# Patient Record
Sex: Female | Born: 1995 | Race: Black or African American | Hispanic: No | Marital: Single | Smoking: Never smoker
Health system: Southern US, Community
[De-identification: ages and names within clinical notes are randomized; demographics above are authoritative.]

## PROBLEM LIST (undated history)

## (undated) ENCOUNTER — Inpatient Hospital Stay (HOSPITAL_COMMUNITY): Payer: Self-pay

## (undated) ENCOUNTER — Ambulatory Visit (HOSPITAL_COMMUNITY): Admission: EM | Discharge status: Absent without leave | Payer: Medicaid Other

## (undated) DIAGNOSIS — E739 Lactose intolerance, unspecified: Secondary | ICD-10-CM

## (undated) DIAGNOSIS — J40 Bronchitis, not specified as acute or chronic: Secondary | ICD-10-CM

---
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
## 2016-01-11 ENCOUNTER — Ambulatory Visit (INDEPENDENT_AMBULATORY_CARE_PROVIDER_SITE_OTHER): Payer: Medicaid Other | Admitting: Certified Nurse Midwife

## 2016-01-11 VITALS — BP 119/71 | HR 95 | Wt 150.0 lb

## 2016-01-11 DIAGNOSIS — Z3493 Encounter for supervision of normal pregnancy, unspecified, third trimester: Secondary | ICD-10-CM

## 2016-01-11 LAB — POCT URINALYSIS DIPSTICK
BILIRUBIN UA: NEGATIVE
Glucose, UA: NEGATIVE
KETONES UA: NEGATIVE
Nitrite, UA: NEGATIVE
PH UA: 7
RBC UA: NEGATIVE
Spec Grav, UA: 1.01
Urobilinogen, UA: NEGATIVE

## 2016-01-11 NOTE — Progress Notes (Signed)
Subjective:    Senaida OresSharlaye Hunn is a 20 y.o. female being seen today for her obstetrical visit. She is at 4012w3d gestation. Patient reports no bleeding, no contractions, no cramping, no leaking and states she is taking her vitamins and iron. Fetal movement: normal.  Problem List Items Addressed This Visit    None    Visit Diagnoses    Prenatal care in third trimester    -  Primary    Relevant Orders    POCT Urinalysis Dipstick      Patient Active Problem List   Diagnosis Date Noted  . Supervision of pregnancy with history of ectopic pregnancy, second trimester 10/10/2015  . 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 Pelvic pain in female   . Ruptured ectopic pregnancy   . ECZEMA, ATOPIC DERMATITIS 10/16/2006   Objective:    BP 119/71 mmHg  Pulse 95  Wt 150 lb (68.04 kg)  LMP 05/20/2015 FHT:  155 BPM  Uterine Size: 30 cm and size equals dates  Presentation: cephalic     Assessment:    Pregnancy @ 4412w3d weeks   Doing well  Plan:     labs reviewed, problem list updated Consent signed. GBS planning TDAP offered  Rhogam given for RH negative Pediatrician: discussed. Infant feeding: plans to breastfeed. Maternity leave: discussed. Cigarette smoking: never smoked. Orders Placed This Encounter  Procedures  . POCT Urinalysis Dipstick   No orders of the defined types were placed in this encounter.   Follow up in 2 Weeks.

## 2016-01-25 ENCOUNTER — Ambulatory Visit (INDEPENDENT_AMBULATORY_CARE_PROVIDER_SITE_OTHER): Payer: Medicaid Other | Admitting: Certified Nurse Midwife

## 2016-01-25 VITALS — BP 120/73 | HR 81 | Wt 153.0 lb

## 2016-01-25 DIAGNOSIS — Z3483 Encounter for supervision of other normal pregnancy, third trimester: Secondary | ICD-10-CM

## 2016-01-25 LAB — POCT URINALYSIS DIPSTICK
BILIRUBIN UA: NEGATIVE
Blood, UA: NEGATIVE
GLUCOSE UA: NEGATIVE
KETONES UA: NEGATIVE
Leukocytes, UA: NEGATIVE
Nitrite, UA: NEGATIVE
SPEC GRAV UA: 1.025
Urobilinogen, UA: NEGATIVE
pH, UA: 5

## 2016-01-25 NOTE — Addendum Note (Signed)
Addended by: Luella CookMCINTYRE, DIRECE E on: 01/25/2016 09:55 AM   Modules accepted: Orders

## 2016-01-25 NOTE — Progress Notes (Signed)
Subjective:    Deanna Duffy is a 20 y.o. female being seen today for her obstetrical visit. She is at 3024w3d gestation. Patient reports backache, no bleeding, no cramping, no leaking and occasional contractions. Fetal movement: normal.  Problem List Items Addressed This Visit    None     Patient Active Problem List   Diagnosis Date Noted  . Supervision of pregnancy with history of ectopic pregnancy, second trimester 10/10/2015  . 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 Pelvic pain in female   . Ruptured ectopic pregnancy   . ECZEMA, ATOPIC DERMATITIS 10/16/2006   Objective:    BP 120/73 mmHg  Pulse 81  Wt 153 lb (69.4 kg)  LMP 05/20/2015 FHT:  135 BPM  Uterine Size: size equals dates  Presentation: unsure     Assessment:    Pregnancy @ 5324w3d weeks    Plan:     labs reviewed, problem list updated Consent signed. GBS planning TDAP offered  Rhogam given for RH negative Pediatrician: discussed. Infant feeding: plans to breastfeed. Maternity leave: discussed. Cigarette smoking: never smoked. No orders of the defined types were placed in this encounter.   No orders of the defined types were placed in this encounter.   Follow up in 2 Weeks with GBS.

## 2016-02-08 ENCOUNTER — Ambulatory Visit (INDEPENDENT_AMBULATORY_CARE_PROVIDER_SITE_OTHER): Payer: Medicaid Other | Admitting: Certified Nurse Midwife

## 2016-02-08 VITALS — BP 117/81 | HR 80 | Wt 152.0 lb

## 2016-02-08 DIAGNOSIS — K5901 Slow transit constipation: Secondary | ICD-10-CM

## 2016-02-08 DIAGNOSIS — Z3403 Encounter for supervision of normal first pregnancy, third trimester: Secondary | ICD-10-CM

## 2016-02-08 LAB — POCT URINALYSIS DIPSTICK
Bilirubin, UA: NEGATIVE
Glucose, UA: NEGATIVE
Ketones, UA: NEGATIVE
NITRITE UA: NEGATIVE
PH UA: 8
Protein, UA: NEGATIVE
RBC UA: NEGATIVE
UROBILINOGEN UA: NEGATIVE

## 2016-02-08 MED ORDER — DOCUSATE SODIUM 100 MG PO CAPS: 100.0000 mg | ORAL_CAPSULE | Freq: Two times a day (BID) | ORAL | Status: DC

## 2016-02-08 MED ORDER — POLYETHYLENE GLYCOL 3350 17 GM/SCOOP PO POWD: 17.0000 g | Freq: Every day | ORAL | Status: DC

## 2016-02-08 NOTE — Progress Notes (Signed)
Subjective:    Senaida OresSharlaye Headen is a 20 y.o. female being seen today for her obstetrical visit. She is at 5068w3d gestation. Patient reports backache, no bleeding, no contractions, no leaking and constipation. Fetal movement: normal.  Problem List Items Addressed This Visit    None    Visit Diagnoses    Encounter for supervision of normal first pregnancy in third trimester    -  Primary    Relevant Orders    POCT urinalysis dipstick (Completed)      Patient Active Problem List   Diagnosis Date Noted  . Supervision of pregnancy with history of ectopic pregnancy, second trimester 10/10/2015  . 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 Pelvic pain in female   . Ruptured ectopic pregnancy   . ECZEMA, ATOPIC DERMATITIS 10/16/2006   Objective:    BP 117/81 mmHg  Pulse 80  Wt 152 lb (68.947 kg)  LMP 05/20/2015 FHT:  145 BPM  Uterine Size: size equals dates  Presentation: cephalic     0.5 cm dliated & constipated.  Assessment:    Pregnancy @ 6468w3d weeks    Constipation in pregnancy.  Plan:  Colace & Miralax for bowel movement assistance.   labs reviewed, problem list updated Consent signed. GBS sent TDAP offered  Rhogam given for RH negative Pediatrician: discussed. Maternity leave: discussed.  Orders Placed This Encounter  Procedures  . POCT urinalysis dipstick   No orders of the defined types were placed in this encounter.   Follow up in 1 Week.

## 2016-02-09 NOTE — Progress Notes (Signed)
I agree with note by NP Student Andrew Brake.  Was present for exam.  R.Mikya Don CNM 

## 2016-02-10 LAB — STREP GP B NAA: Strep Gp B NAA: NEGATIVE

## 2016-02-12 LAB — NUSWAB VG+, CANDIDA 6SP
ATOPOBIUM VAGINAE: HIGH {score} — AB
CANDIDA GLABRATA, NAA: NEGATIVE
CANDIDA KRUSEI, NAA: NEGATIVE
CANDIDA LUSITANIAE, NAA: NEGATIVE
CANDIDA PARAPSILOSIS, NAA: NEGATIVE
Candida albicans, NAA: NEGATIVE
Candida tropicalis, NAA: NEGATIVE
Chlamydia trachomatis, NAA: NEGATIVE
MEGASPHAERA 1: HIGH {score} — AB
Neisseria gonorrhoeae, NAA: NEGATIVE
TRICH VAG BY NAA: NEGATIVE

## 2016-02-13 ENCOUNTER — Other Ambulatory Visit: Payer: Self-pay | Admitting: Certified Nurse Midwife

## 2016-02-13 DIAGNOSIS — N760 Acute vaginitis: Principal | ICD-10-CM

## 2016-02-13 DIAGNOSIS — B9689 Other specified bacterial agents as the cause of diseases classified elsewhere: Secondary | ICD-10-CM

## 2016-02-13 MED ORDER — METRONIDAZOLE 500 MG PO TABS: 500.0000 mg | ORAL_TABLET | Freq: Two times a day (BID) | ORAL | Status: DC

## 2016-02-15 ENCOUNTER — Telehealth: Payer: Self-pay | Admitting: *Deleted

## 2016-02-15 ENCOUNTER — Ambulatory Visit (INDEPENDENT_AMBULATORY_CARE_PROVIDER_SITE_OTHER): Payer: Medicaid Other | Admitting: Certified Nurse Midwife

## 2016-02-15 VITALS — BP 106/71 | HR 83 | Wt 156.0 lb

## 2016-02-15 DIAGNOSIS — Z3403 Encounter for supervision of normal first pregnancy, third trimester: Secondary | ICD-10-CM

## 2016-02-15 LAB — POCT URINALYSIS DIPSTICK
Bilirubin, UA: NEGATIVE
Blood, UA: NEGATIVE
Glucose, UA: NEGATIVE
Ketones, UA: NEGATIVE
LEUKOCYTES UA: NEGATIVE
NITRITE UA: NEGATIVE
PH UA: 7
PROTEIN UA: NEGATIVE
Spec Grav, UA: 1.01
Urobilinogen, UA: NEGATIVE

## 2016-02-15 NOTE — Telephone Encounter (Signed)
Spoke with Rosanna RandySharlaye to inform that she has BV & that Metronidazole has been sent to pharmacy.

## 2016-02-15 NOTE — Progress Notes (Signed)
Subjective:    Deanna OresSharlaye Duffy is a 20 y.o. female being seen today for her obstetrical visit. She is at 8328w3d gestation. Patient reports no complaints. Fetal movement: normal.  Problem List Items Addressed This Visit    None     Patient Active Problem List   Diagnosis Date Noted  . Supervision of pregnancy with history of ectopic pregnancy, second trimester 10/10/2015  . 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 Pelvic pain in female   . Ruptured ectopic pregnancy   . ECZEMA, ATOPIC DERMATITIS 10/16/2006    Objective:    BP 106/71 mmHg  Pulse 83  Wt 156 lb (70.761 kg)  LMP 05/20/2015 FHT: 159 BPM  Uterine Size: size equals dates  Presentations: cephalic  Pelvic Exam: deferred     Assessment:    Pregnancy @ 4028w3d weeks   Doing well  Plan:   Plans for delivery: Vaginal anticipated; labs reviewed; problem list updated Counseling: Consent signed. Infant feeding: plans to breastfeed. Cigarette smoking: never smoked. L&D discussion: symptoms of labor, discussed when to call, discussed what number to call, anesthetic/analgesic options reviewed and delivering clinician:  plans no preference. Postpartum supports and preparation: circumcision discussed and contraception plans discussed.  Follow up in 1 Week.

## 2016-02-15 NOTE — Addendum Note (Signed)
Addended by: Marya LandryFOSTER, Alainna Stawicki D on: 02/15/2016 12:01 PM   Modules accepted: Orders

## 2016-02-18 ENCOUNTER — Inpatient Hospital Stay (HOSPITAL_COMMUNITY)
Admission: AD | Admit: 2016-02-18 | Discharge: 2016-02-18 | Disposition: A | Discharge status: Home / Self Care | Payer: Medicaid Other | Source: Ambulatory Visit | Attending: Family Medicine | Admitting: Family Medicine

## 2016-02-18 ENCOUNTER — Encounter (HOSPITAL_COMMUNITY): Payer: Self-pay | Admitting: *Deleted

## 2016-02-18 DIAGNOSIS — Z3493 Encounter for supervision of normal pregnancy, unspecified, third trimester: Secondary | ICD-10-CM | POA: Insufficient documentation

## 2016-02-18 NOTE — Progress Notes (Signed)
Dr Ottie GlazierGunadasa notified of pt's admission and status. No new orders

## 2016-02-18 NOTE — Progress Notes (Signed)
Dr Ottie GlazierGunadasa notified of pt's cervical reck and FM strip. Asked for provider to review FM strip

## 2016-02-18 NOTE — Progress Notes (Signed)
Written and verbal d/c instructions given and understanding voiced. 

## 2016-02-18 NOTE — Progress Notes (Signed)
Pt had turned herself to R lat at some point. Semifowlers after sve

## 2016-02-18 NOTE — MAU Note (Signed)
Having contractions since 2300. Lost mucous plug. Went for walk and started having a lot of pressure in pelvis and pain more on R side of abdomen going around to R back. Denies LOF or bleeding

## 2016-02-18 NOTE — Progress Notes (Signed)
Thressa ShellerHeather Hogan CNM reviewed EFM

## 2016-02-18 NOTE — Discharge Instructions (Signed)
Vaginal Delivery °During delivery, your health care provider will help you give birth to your baby. During a vaginal delivery, you will work to push the baby out of your vagina. However, before you can push your baby out, a few things need to happen. The opening of your uterus (cervix) has to soften, thin out, and open up (dilate) all the way to 10 cm. Also, your baby has to move down from the uterus into your vagina.  °SIGNS OF LABOR  °Your health care provider will first need to make sure you are in labor. Signs of labor include:  °· Passing what is called the mucous plug before labor begins. This is a small amount of blood-stained mucus. °· Having regular, painful uterine contractions.   °· The time between contractions gets shorter.   °· The discomfort and pain gradually get more intense. °· Contraction pains get worse when walking and do not go away when resting.   °· Your cervix becomes thinner (effacement) and dilates. °BEFORE THE DELIVERY °Once you are in labor and admitted into the hospital or care center, your health care provider may do the following:  °· Perform a complete physical exam. °· Review any complications related to pregnancy or labor.  °· Check your blood pressure, pulse, temperature, and heart rate (vital signs).   °· Determine if, and when, the rupture of amniotic membranes occurred. °· Do a vaginal exam (using a sterile glove and lubricant) to determine:   °¨ The position (presentation) of the baby. Is the baby's head presenting first (vertex) in the birth canal (vagina), or are the feet or buttocks first (breech)?   °¨ The level (station) of the baby's head within the birth canal.   °¨ The effacement and dilatation of the cervix.   °· An electronic fetal monitor is usually placed on your abdomen when you first arrive. This is used to monitor your contractions and the baby's heart rate. °¨ When the monitor is on your abdomen (external fetal monitor), it can only pick up the frequency and  length of your contractions. It cannot tell the strength of your contractions. °¨ If it becomes necessary for your health care provider to know exactly how strong your contractions are or to see exactly what the baby's heart rate is doing, an internal monitor may be inserted into your vagina and uterus. Your health care provider will discuss the benefits and risks of using an internal monitor and obtain your permission before inserting the device. °¨ Continuous fetal monitoring may be needed if you have an epidural, are receiving certain medicines (such as oxytocin), or have pregnancy or labor complications. °· An IV access tube may be placed into a vein in your arm to deliver fluids and medicines if necessary. °THREE STAGES OF LABOR AND DELIVERY °Normal labor and delivery is divided into three stages. °First Stage °This stage starts when you begin to contract regularly and your cervix begins to efface and dilate. It ends when your cervix is completely open (fully dilated). The first stage is the longest stage of labor and can last from 3 hours to 15 hours.  °Several methods are available to help with labor pain. You and your health care provider will decide which option is best for you. Options include:  °· Opioid medicines. These are strong pain medicines that you can get through your IV tube or as a shot into your muscle. These medicines lessen pain but do not make it go away completely.  °· Epidural. A medicine is given through a thin tube that   is inserted in your back. The medicine numbs the lower part of your body and prevents any pain in that area. °· Paracervical pain medicine. This is an injection of an anesthetic on each side of your cervix.   °· You may request natural childbirth, which does not involve the use of pain medicines or an epidural during labor and delivery. Instead, you will use other things, such as breathing exercises, to help cope with the pain. °Second Stage °The second stage of labor  begins when your cervix is fully dilated at 10 cm. It continues until you push your baby down through the birth canal and the baby is born. This stage can take only minutes or several hours. °· The location of your baby's head as it moves through the birth canal is reported as a number called a station. If the baby's head has not started its descent, the station is described as being at minus 3 (-3). When your baby's head is at the zero station, it is at the middle of the birth canal and is engaged in the pelvis. The station of your baby helps indicate the progress of the second stage of labor. °· When your baby is born, your health care provider may hold the baby with his or her head lowered to prevent amniotic fluid, mucus, and blood from getting into the baby's lungs. The baby's mouth and nose may be suctioned with a small bulb syringe to remove any additional fluid. °· Your health care provider may then place the baby on your stomach. It is important to keep the baby from getting cold. To do this, the health care provider will dry the baby off, place the baby directly on your skin (with no blankets between you and the baby), and cover the baby with warm, dry blankets.   °· The umbilical cord is cut. °Third Stage °During the third stage of labor, your health care provider will deliver the placenta (afterbirth) and make sure your bleeding is under control. The delivery of the placenta usually takes about 5 minutes but can take up to 30 minutes. After the placenta is delivered, a medicine may be given either by IV or injection to help contract the uterus and control bleeding. If you are planning to breastfeed, you can try to do so now. °After you deliver the placenta, your uterus should contract and get very firm. If your uterus does not remain firm, your health care provider will massage it. This is important because the contraction of the uterus helps cut off bleeding at the site where the placenta was attached  to your uterus. If your uterus does not contract properly and stay firm, you may continue to bleed heavily. If there is a lot of bleeding, medicines may be given to contract the uterus and stop the bleeding.  °  °This information is not intended to replace advice given to you by your health care provider. Make sure you discuss any questions you have with your health care provider. °  °Document Released: 05/14/2008 Document Revised: 08/26/2014 Document Reviewed: 04/01/2012 °Elsevier Interactive Patient Education ©2016 [employer] ° °

## 2016-02-23 ENCOUNTER — Ambulatory Visit (INDEPENDENT_AMBULATORY_CARE_PROVIDER_SITE_OTHER): Payer: Medicaid Other | Admitting: Certified Nurse Midwife

## 2016-02-23 VITALS — BP 124/55 | HR 52 | Wt 154.0 lb

## 2016-02-23 DIAGNOSIS — O0913 Supervision of pregnancy with history of ectopic or molar pregnancy, third trimester: Secondary | ICD-10-CM

## 2016-02-23 DIAGNOSIS — O0912 Supervision of pregnancy with history of ectopic or molar pregnancy, second trimester: Secondary | ICD-10-CM

## 2016-02-23 DIAGNOSIS — Z3403 Encounter for supervision of normal first pregnancy, third trimester: Secondary | ICD-10-CM

## 2016-02-23 LAB — POCT URINALYSIS DIPSTICK
Bilirubin, UA: NEGATIVE
Blood, UA: NEGATIVE
GLUCOSE UA: NEGATIVE
Ketones, UA: NEGATIVE
LEUKOCYTES UA: NEGATIVE
Nitrite, UA: NEGATIVE
SPEC GRAV UA: 1.015
UROBILINOGEN UA: NEGATIVE
pH, UA: 6

## 2016-02-23 NOTE — Assessment & Plan Note (Signed)
  Clinic  [facility] Prenatal Labs  Dating  US 07/19/15 Blood type: O/POS/-- (02/21 1552)   Genetic Screen 1 Screen:    AFP:  Negative   Quad:     NIPS: Antibody:NEG (02/21 1552)  Anatomic US  Normal Rubella: 6.94 (02/21 1552)  GTT Early:               Third trimester: Normal RPR: Non Reactive (04/28 1106)   Flu vaccine  HBsAg: NEGATIVE (02/21 1552)   TDaP vaccine                                               Rhogam:N/A HIV: Non Reactive (04/28 1106)   Baby Food     Breast                                          ZOX:WRUEAVWUGBS:Negative (06/22 1647)(For PCN allergy, check sensitivities)  Contraception   Rutha BouchardKyleena IUD Pap: N/A  Circumcision  N/A   [facility]  [facility]   Support Person

## 2016-02-23 NOTE — Progress Notes (Signed)
Subjective:    Deanna Duffy is a 20 y.o. female being seen today for her obstetrical visit. She is at 7268w4d gestation. Patient reports no complaints. Fetal movement: normal.  Problem List Items Addressed This Visit      Other   Supervision of pregnancy with history of ectopic pregnancy, second trimester     Clinic  [facility] Prenatal Labs  Dating  US 07/19/15 Blood type: O/POS/-- (02/21 1552)   Genetic Screen 1 Screen:    AFP:  Negative   Quad:     NIPS: Antibody:NEG (02/21 1552)  Anatomic US  Normal Rubella: 6.94 (02/21 1552)  GTT Early:               Third trimester: Normal RPR: Non Reactive (04/28 1106)   Flu vaccine  HBsAg: NEGATIVE (02/21 1552)   TDaP vaccine                                               Rhogam:N/A HIV: Non Reactive (04/28 1106)   Baby Food     Breast                                          ZOX:WRUEAVWUGBS:Negative (06/22 1647)(For PCN allergy, check sensitivities)  Contraception   Rutha BouchardKyleena IUD Pap: N/A  Circumcision  N/A   [facility]   Support Person           Other Visit Diagnoses    Encounter for supervision of normal first pregnancy in third trimester    -  Primary    Relevant Orders    POCT urinalysis dipstick      Patient Active Problem List   Diagnosis Date Noted  . Supervision of pregnancy with history of ectopic pregnancy, second trimester 10/10/2015  . 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 Pelvic pain in female   . Ruptured ectopic pregnancy   . ECZEMA, ATOPIC DERMATITIS 10/16/2006    Objective:    BP 124/55 mmHg  Pulse 52  Wt 154 lb (69.854 kg)  LMP 05/20/2015 FHT: 140 BPM  Uterine Size: size equals dates  Presentations: cephalic  Pelvic Exam: deferred     Assessment:    Pregnancy @ 1568w4d weeks   Doing well  Plan:   Plans for delivery: Vaginal anticipated; labs reviewed; problem list updated Counseling: Consent signed. Infant feeding: plans to breastfeed. Cigarette smoking: quit at start of pregnancy. L&D  discussion: symptoms of labor, discussed when to call, discussed what number to call, anesthetic/analgesic options reviewed and delivering clinician:  plans no preference. Postpartum supports and preparation: circumcision discussed and contraception plans discussed.  Follow up in 1 Week.

## 2016-02-27 ENCOUNTER — Encounter (HOSPITAL_COMMUNITY): Payer: Self-pay

## 2016-02-27 ENCOUNTER — Inpatient Hospital Stay (HOSPITAL_COMMUNITY)
Admission: AD | Admit: 2016-02-27 | Discharge: 2016-02-27 | Disposition: A | Discharge status: Home / Self Care | Payer: Medicaid Other | Source: Ambulatory Visit | Attending: Obstetrics & Gynecology | Admitting: Obstetrics & Gynecology

## 2016-02-27 DIAGNOSIS — Z3A00 Weeks of gestation of pregnancy not specified: Secondary | ICD-10-CM | POA: Insufficient documentation

## 2016-02-27 DIAGNOSIS — Z3403 Encounter for supervision of normal first pregnancy, third trimester: Secondary | ICD-10-CM | POA: Insufficient documentation

## 2016-02-27 NOTE — MAU Note (Signed)
Been contracting since yesterday.  Getting closer and stronger.  Kept waking her during the night. No bleeding.  No leaking.

## 2016-02-29 ENCOUNTER — Ambulatory Visit (INDEPENDENT_AMBULATORY_CARE_PROVIDER_SITE_OTHER): Payer: Medicaid Other | Admitting: Certified Nurse Midwife

## 2016-02-29 VITALS — BP 122/78 | HR 69 | Temp 98.1°F | Wt 152.0 lb

## 2016-02-29 DIAGNOSIS — Z3403 Encounter for supervision of normal first pregnancy, third trimester: Secondary | ICD-10-CM | POA: Diagnosis not present

## 2016-02-29 DIAGNOSIS — O0912 Supervision of pregnancy with history of ectopic or molar pregnancy, second trimester: Secondary | ICD-10-CM

## 2016-02-29 DIAGNOSIS — O479 False labor, unspecified: Secondary | ICD-10-CM | POA: Diagnosis not present

## 2016-02-29 LAB — POCT URINALYSIS DIPSTICK
Bilirubin, UA: NEGATIVE
GLUCOSE UA: NEGATIVE
Ketones, UA: NEGATIVE
Leukocytes, UA: NEGATIVE
Nitrite, UA: NEGATIVE
RBC UA: NEGATIVE
SPEC GRAV UA: 1.02
UROBILINOGEN UA: NEGATIVE
pH, UA: 6

## 2016-02-29 MED ORDER — OXYCODONE-ACETAMINOPHEN 5-325 MG PO TABS: 1.0000 | ORAL_TABLET | ORAL | Status: DC | PRN

## 2016-02-29 NOTE — Progress Notes (Signed)
Subjective:    Deanna Duffy is a 20 y.o. female being seen today for her obstetrical visit. She is at 7369w3d gestation. Patient reports no bleeding, no leaking and occasional contractions. Fetal movement: normal.  Problem List Items Addressed This Visit    None    Visit Diagnoses    Encounter for supervision of normal first pregnancy in third trimester    -  Primary    Relevant Orders    POCT urinalysis dipstick      Patient Active Problem List   Diagnosis Date Noted  . Supervision of pregnancy with history of ectopic pregnancy, second trimester 10/10/2015  . 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 Pelvic pain in female   . Ruptured ectopic pregnancy   . ECZEMA, ATOPIC DERMATITIS 10/16/2006    Objective:    BP 122/78 mmHg  Pulse 69  Temp(Src) 98.1 F (36.7 C)  Wt 152 lb (68.947 kg)  LMP 05/20/2015 FHT: 150 BPM  Uterine Size: 35 cm and size equals dates  Presentations: cephalic  Pelvic Exam:              Dilation: 1cm       Effacement: 50%             Station:  -3    Consistency: soft            Position: posterior     Assessment:    Pregnancy @ 4569w3d weeks   Braxton Hicks Contractions  Plan:   Plans for delivery: Vaginal anticipated; labs reviewed; problem list updated Counseling: Consent signed. Infant feeding: plans to breastfeed. Cigarette smoking: never smoked. L&D discussion: symptoms of labor, discussed when to call, discussed what number to call, anesthetic/analgesic options reviewed and delivering clinician:  plans no preference. Postpartum supports and preparation: circumcision discussed and contraception plans discussed.  Follow up in 1 Week with NST.

## 2016-02-29 NOTE — Progress Notes (Signed)
Patient was seen at [facility] a couple days ago- she is having contractions-but not regular enough.

## 2016-02-29 NOTE — Assessment & Plan Note (Signed)
  Clinic  [facility] Prenatal Labs  Dating  Blood type: O/POS/-- (02/21 1552)   Genetic Screen 1 Screen:    AFP:     Quad:     NIPS: Antibody:NEG (02/21 1552)  Anatomic US  Rubella: 6.94 (02/21 1552)  GTT Early:               Third trimester:  RPR: Non Reactive (04/28 1106)   Flu vaccine  HBsAg: NEGATIVE (02/21 1552)   TDaP vaccine                                               Rhogam: N/A HIV: Non Reactive (04/28 1106)   Baby Food                Breast                               FAO:ZHYQMVHQGBS:Negative (06/22 1647)(For PCN allergy, check sensitivities)  Contraception  IUD Pap:  Circumcision  N/A   [facility]  [facility]   Support Person

## 2016-03-02 ENCOUNTER — Inpatient Hospital Stay (HOSPITAL_COMMUNITY)
Admission: AD | Admit: 2016-03-02 | Discharge: 2016-03-07 | DRG: 766 | Disposition: A | Discharge status: Home / Self Care | Payer: Medicaid Other | Source: Ambulatory Visit | Attending: Obstetrics & Gynecology | Admitting: Obstetrics & Gynecology

## 2016-03-02 ENCOUNTER — Encounter (HOSPITAL_COMMUNITY): Payer: Self-pay | Admitting: Certified Nurse Midwife

## 2016-03-02 ENCOUNTER — Inpatient Hospital Stay (HOSPITAL_COMMUNITY): Payer: Medicaid Other | Admitting: Anesthesiology

## 2016-03-02 DIAGNOSIS — Z3A39 39 weeks gestation of pregnancy: Secondary | ICD-10-CM | POA: Diagnosis not present

## 2016-03-02 DIAGNOSIS — O1414 Severe pre-eclampsia complicating childbirth: Secondary | ICD-10-CM | POA: Diagnosis present

## 2016-03-02 DIAGNOSIS — Z23 Encounter for immunization: Secondary | ICD-10-CM | POA: Diagnosis not present

## 2016-03-02 DIAGNOSIS — Z91018 Allergy to other foods: Secondary | ICD-10-CM

## 2016-03-02 DIAGNOSIS — Z98891 History of uterine scar from previous surgery: Secondary | ICD-10-CM

## 2016-03-02 DIAGNOSIS — IMO0001 Reserved for inherently not codable concepts without codable children: Secondary | ICD-10-CM

## 2016-03-02 LAB — CBC
HEMATOCRIT: 31.7 % — AB (ref 36.0–46.0)
HEMOGLOBIN: 10.7 g/dL — AB (ref 12.0–15.0)
MCH: 28.1 pg (ref 26.0–34.0)
MCHC: 33.8 g/dL (ref 30.0–36.0)
MCV: 83.2 fL (ref 78.0–100.0)
Platelets: 236 10*3/uL (ref 150–400)
RBC: 3.81 MIL/uL — AB (ref 3.87–5.11)
RDW: 14.7 % (ref 11.5–15.5)
WBC: 13 10*3/uL — ABNORMAL HIGH (ref 4.0–10.5)

## 2016-03-02 LAB — COMPREHENSIVE METABOLIC PANEL
ALBUMIN: 3.2 g/dL — AB (ref 3.5–5.0)
ALT: 14 U/L (ref 14–54)
AST: 17 U/L (ref 15–41)
Alkaline Phosphatase: 195 U/L — ABNORMAL HIGH (ref 38–126)
Anion gap: 9 (ref 5–15)
BILIRUBIN TOTAL: 0.3 mg/dL (ref 0.3–1.2)
BUN: 12 mg/dL (ref 6–20)
CO2: 19 mmol/L — AB (ref 22–32)
Calcium: 9.1 mg/dL (ref 8.9–10.3)
Chloride: 107 mmol/L (ref 101–111)
Creatinine, Ser: 0.81 mg/dL (ref 0.44–1.00)
GFR calc Af Amer: >60 mL/min (ref >60)
GFR calc non Af Amer: >60 mL/min (ref >60)
Glucose, Bld: 81 mg/dL (ref 65–99)
POTASSIUM: 3.8 mmol/L (ref 3.5–5.1)
SODIUM: 135 mmol/L (ref 135–145)
TOTAL PROTEIN: 7.1 g/dL (ref 6.5–8.1)

## 2016-03-02 LAB — PROTEIN / CREATININE RATIO, URINE
Creatinine, Urine: 139 mg/dL
PROTEIN CREATININE RATIO: 0.18 mg/mg{creat} — AB (ref 0.00–0.15)
Total Protein, Urine: 25 mg/dL

## 2016-03-02 LAB — TYPE AND SCREEN
ABO/RH(D): O POS
ANTIBODY SCREEN: NEGATIVE

## 2016-03-02 MED ORDER — ACETAMINOPHEN 500 MG PO TABS: 1000.0000 mg | ORAL_TABLET | Freq: Once | ORAL | Status: DC

## 2016-03-02 MED ORDER — LACTATED RINGERS IV SOLN: 500.0000 mL | Freq: Once | INTRAVENOUS | Status: DC

## 2016-03-02 MED ORDER — LIDOCAINE HCL (PF) 1 % IJ SOLN
INTRAMUSCULAR | Status: DC | PRN
  Administered 2016-03-02: 4 mL via EPIDURAL
  Administered 2016-03-02: 6 mL via EPIDURAL

## 2016-03-02 MED ORDER — EPHEDRINE 5 MG/ML INJ: 10.0000 mg | INTRAVENOUS | Status: DC | PRN

## 2016-03-02 MED ORDER — PHENYLEPHRINE 40 MCG/ML (10ML) SYRINGE FOR IV PUSH (FOR BLOOD PRESSURE SUPPORT): 80.0000 ug | PREFILLED_SYRINGE | INTRAVENOUS | Status: DC | PRN

## 2016-03-02 MED ORDER — DIPHENHYDRAMINE HCL 50 MG/ML IJ SOLN: 12.5000 mg | INTRAMUSCULAR | Status: DC | PRN

## 2016-03-02 MED ORDER — LACTATED RINGERS IV SOLN
INTRAVENOUS | Status: DC
  Administered 2016-03-03: 100 mL/h via INTRAVENOUS

## 2016-03-02 MED ORDER — ONDANSETRON HCL 4 MG/2ML IJ SOLN
4.0000 mg | Freq: Four times a day (QID) | INTRAMUSCULAR | Status: DC | PRN
  Administered 2016-03-03: 4 mg via INTRAVENOUS
  Filled 2016-03-02: qty 2

## 2016-03-02 MED ORDER — OXYTOCIN BOLUS FROM INFUSION: 500.0000 mL | INTRAVENOUS | Status: DC

## 2016-03-02 MED ORDER — LACTATED RINGERS IV SOLN
500.0000 mL | Freq: Once | INTRAVENOUS | Status: AC
  Administered 2016-03-02: 500 mL via INTRAVENOUS

## 2016-03-02 MED ORDER — LIDOCAINE HCL (PF) 1 % IJ SOLN: 30.0000 mL | INTRAMUSCULAR | Status: DC | PRN

## 2016-03-02 MED ORDER — FENTANYL 2.5 MCG/ML BUPIVACAINE 1/10 % EPIDURAL INFUSION (WH - ANES)
14.0000 mL/h | INTRAMUSCULAR | Status: DC | PRN
  Administered 2016-03-02: 12 mL/h via EPIDURAL
  Administered 2016-03-02: 14 mL/h via EPIDURAL
  Filled 2016-03-02: qty 125

## 2016-03-02 MED ORDER — OXYTOCIN 40 UNITS IN LACTATED RINGERS INFUSION - SIMPLE MED
2.5000 [IU]/h | INTRAVENOUS | Status: DC
  Filled 2016-03-02: qty 1000

## 2016-03-02 MED ORDER — SOD CITRATE-CITRIC ACID 500-334 MG/5ML PO SOLN
30.0000 mL | ORAL | Status: DC | PRN
  Administered 2016-03-03: 30 mL via ORAL
  Filled 2016-03-02: qty 15

## 2016-03-02 MED ORDER — PHENYLEPHRINE 40 MCG/ML (10ML) SYRINGE FOR IV PUSH (FOR BLOOD PRESSURE SUPPORT)
80.0000 ug | PREFILLED_SYRINGE | INTRAVENOUS | Status: DC | PRN
  Filled 2016-03-02: qty 10

## 2016-03-02 MED ORDER — LACTATED RINGERS IV SOLN
500.0000 mL | INTRAVENOUS | Status: DC | PRN
  Administered 2016-03-03: 500 mL via INTRAVENOUS

## 2016-03-02 MED ORDER — LACTATED RINGERS IV SOLN
2.0000 g/h | INTRAVENOUS | Status: DC
  Filled 2016-03-02: qty 80

## 2016-03-02 MED ORDER — LABETALOL HCL 5 MG/ML IV SOLN: 20.0000 mg | INTRAVENOUS | Status: DC | PRN

## 2016-03-02 MED ORDER — MAGNESIUM SULFATE BOLUS VIA INFUSION
4.0000 g | Freq: Once | INTRAVENOUS | Status: DC
  Administered 2016-03-02: 4 g via INTRAVENOUS
  Filled 2016-03-02: qty 500

## 2016-03-02 MED ORDER — HYDRALAZINE HCL 20 MG/ML IJ SOLN: 10.0000 mg | Freq: Once | INTRAMUSCULAR | Status: DC | PRN

## 2016-03-02 MED ORDER — LACTATED RINGERS IV SOLN: INTRAVENOUS | Status: DC

## 2016-03-02 MED ORDER — ACETAMINOPHEN 325 MG PO TABS: 650.0000 mg | ORAL_TABLET | ORAL | Status: DC | PRN

## 2016-03-02 NOTE — H&P (Signed)
LABOR AND DELIVERY ADMISSION HISTORY AND PHYSICAL NOTE  Rayvin Abid is a 20 y.o. female G2P0010 with IUP at [redacted]w[redacted]d by 7 wk u/s presenting for painful contractions. 5/10 headache today, no vision change or upper abdominal pain or sob.   She reports positive fetal movement. She denies leakage of fluid or vaginal bleeding.  Prenatal History/Complications:  Past Medical History: Past Medical History  Diagnosis Date  . Bronchitis   . Lactose intolerance     Past Surgical History: Past Surgical History  Procedure Laterality Date  . Laparoscopy N/A 03/07/2015    Procedure: LAPAROSCOPY DIAGNOSTIC;  Surgeon: Willodean Rosenthal, MD;  Location: [facility];  Service: Gynecology;  Laterality: N/A;  . Unilateral salpingectomy Right 03/07/2015    Procedure: UNILATERAL SALPINGECTOMYwith ectopic pregnancy;  Surgeon: Willodean Rosenthal, MD;  Location: [facility];  Service: Gynecology;  Laterality: Right;    Obstetrical History: OB History    Gravida Para Term Preterm AB TAB SAB Ectopic Multiple Living   0      Social History: Social History   Social History  . Marital Status: Single    Spouse Name: N/A  . Number of Children: N/A  . Years of Education: N/A   Social History Main Topics  . Smoking status: Never Smoker   . Smokeless tobacco: Never Used  . Alcohol Use: No  . Drug Use: No  . Sexual Activity: Yes   Other Topics Concern  . None   Social History Narrative    Family History: Family History  Problem Relation Age of Onset  . Cancer Neg Hx   . Diabetes Neg Hx   . Hypertension Neg Hx     Allergies: Allergies  Allergen Reactions  . Prescott Gum [Fish Allergy] Other (See Comments)    Unknown reaction.  . Chocolate Rash    Prescriptions prior to admission  Medication Sig Dispense Refill Last Dose  . iron polysaccharides (NIFEREX) 150 MG capsule Take 1 capsule (150 mg total) by mouth daily. 30 capsule 4 Taking  . mometasone (NASONEX) 50 MCG/ACT nasal spray  Place 2 sprays into the nose daily. 17 g 12 Taking  . oxyCODONE-acetaminophen (PERCOCET/ROXICET) 5-325 MG tablet Take 1 tablet by mouth every 4 (four) hours as needed for severe pain. 20 tablet 0   . Prenatal Vit-Fe Phos-FA-Omega (VITAFOL GUMMIES) 3.33-0.333-34.8 MG CHEW Chew 3 tablets by mouth daily. 90 tablet 12 Taking     Review of Systems   All systems reviewed and negative except as stated in HPI  Blood pressure 143/98, pulse 83, temperature 98.5 F (36.9 C), temperature source Oral, resp. rate 18, last menstrual period 05/20/2015, SpO2 100 %. General appearance: alert, cooperative and appears stated age Lungs: clear to auscultation bilaterally Heart: regular rate and rhythm Abdomen: soft, non-tender; bowel sounds normal Extremities: No calf swelling or tenderness Presentation: cephalic per rn exam Fetal monitoring: 130/mod/+a/-d Uterine activity: q 4-10 min  Dilation: 4.5 Effacement (%): 100 Station: -1 Exam by:: Dione Plover RN   Prenatal labs: ABO, Rh: O/POS/-- (02/21 1552) Antibody: NEG (02/21 1552) Rubella: !Error!immune RPR: Non Reactive (04/28 1106)  HBsAg: NEGATIVE (02/21 1552)  HIV: Non Reactive (04/28 1106)  GBS: Negative (06/22 1647)  2 hr Glucola: 81/128/105 Genetic screening:  Quad wnl3 Anatomy US: wnl  Prenatal Transfer Tool  Maternal Diabetes: No Genetic Screening: Normal Maternal Ultrasounds/Referrals: Normal Fetal Ultrasounds or other Referrals:  None Maternal Substance Abuse:  No Significant Maternal Medications:  None Significant Maternal Lab Results:  Lab values include: Group B Strep negative  No results found for this or any previous visit (from the past 24 hour(s)).  Patient Active Problem List   Diagnosis Date Noted  . Supervision of pregnancy with history of ectopic pregnancy, second trimester 10/10/2015  . 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 Pelvic pain in female   . Ruptured ectopic pregnancy   . ECZEMA, ATOPIC DERMATITIS 10/16/2006     Assessment: Senaida OresSharlaye Snelling is a 20 y.o. G2P0010 at 5557w5d here for active labor. Also initial bp elevated, and with 5/10 headache  #Labor: admit, expectant #Pain: Eventual epidural #FWB: Cat 1 #ID:  gbs neg #MOF: breast #MOC: lng-iud #Circ:  N/a #Elevated bp: mild/mod elevation on first two bp readings. Also with 5/10 ha today. PreE/hellp labs ordered. Will continue to trend bp. Will give tylenol and fluids. If ha persists or other s/s of severe disease present, will start mg. No indication for antihypertensive at this time.  Cherrie Gauzeoah B Julena Barbour 03/02/2016, 7:45 PM

## 2016-03-02 NOTE — Progress Notes (Signed)
Patient ID: Deanna Duffy, female   DOB: 09/26/1995, 20 y.o.   MRN: [id number] Deanna Duffy is a 20 y.o. G2P0010 at 3289w5d.  Subjective: Comfortable w/ epidural. HA is now mild w/out intervention.  Objective: Temp:  [98.1 F (36.7 C)-98.5 F (36.9 C)] 98.3 F (36.8 C) (07/15 2132) Pulse Rate:  [76-91] 86 (07/15 2142) Resp:  [18] 18 (07/15 2132) BP: (139-165)/(83-104) 152/101 mmHg (07/15 2142) SpO2:  [99 %-100 %] 99 % (07/15 2141) Weight:  [154 lb (69.854 kg)] 154 lb (69.854 kg) (07/15 2014)   FHT:  FHR: 130 bpm, variability: mod,  accelerations:  15x15,  decelerations:  none UC:   Q 5 minutes, mod-strong  Dilation: 7 Effacement (%): 90 Cervical Position: Anterior Station: -2 Presentation: Vertex Exam by:: Sharnika Binney,cnm AROM moderate amount of light MSF  Labs: Results for orders placed or performed during the hospital encounter of 03/02/16 (from the past 24 hour(s))  Protein / creatinine ratio, urine     Status: Abnormal   Collection Time: 03/02/16  7:00 PM  Result Value Ref Range   Creatinine, Urine 139.00 mg/dL   Total Protein, Urine 25 mg/dL   Protein Creatinine Ratio 0.18 (H) 0.00 - 0.15 mg/mg[Cre]  CBC     Status: Abnormal   Collection Time: 03/02/16  7:53 PM  Result Value Ref Range   WBC 13.0 (H) 4.0 - 10.5 K/uL   RBC 3.81 (L) 3.87 - 5.11 MIL/uL   Hemoglobin 10.7 (L) 12.0 - 15.0 g/dL   HCT 78.231.7 (L) 95.636.0 - 21.346.0 %   MCV 83.2 78.0 - 100.0 fL   MCH 28.1 26.0 - 34.0 pg   MCHC 33.8 30.0 - 36.0 g/dL   RDW 08.614.7 57.811.5 - 46.915.5 %   Platelets 236 150 - 400 K/uL  Type and screen [facility]     Status: None   Collection Time: 03/02/16  7:53 PM  Result Value Ref Range   ABO/RH(D) O POS    Antibody Screen NEG    Sample Expiration 03/05/2016   Comprehensive metabolic panel     Status: Abnormal   Collection Time: 03/02/16  7:53 PM  Result Value Ref Range   Sodium 135 135 - 145 mmol/L   Potassium 3.8 3.5 - 5.1 mmol/L   Chloride 107 101 - 111  mmol/L   CO2 19 (L) 22 - 32 mmol/L   Glucose, Bld 81 65 - 99 mg/dL   BUN 12 6 - 20 mg/dL   Creatinine, Ser 6.290.81 0.44 - 1.00 mg/dL   Calcium 9.1 8.9 - 52.810.3 mg/dL   Total Protein 7.1 6.5 - 8.1 g/dL   Albumin 3.2 (L) 3.5 - 5.0 g/dL   AST 17 15 - 41 U/L   ALT 14 14 - 54 U/L   Alkaline Phosphatase 195 (H) 38 - 126 U/L   Total Bilirubin 0.3 0.3 - 1.2 mg/dL   GFR calc non Af Amer >60 >60 mL/min   GFR calc Af Amer >60 >60 mL/min   Anion gap 9 5 - 15    Assessment / Plan: 2789w5d week IUP Labor: Transition Fetal Wellbeing:  Category I Pain Control:  Epidural Anticipated MOD:  SVD Now w/ second severe-range BP will Dx pt w/ Pre-E w/ severe features adn start Mag Sulfate.  Consider Pitocin if labor stalls after Mag Sulfate.   [place] Cambelle Suchecki, [place] 03/02/2016 10:24 PM

## 2016-03-02 NOTE — Anesthesia Preprocedure Evaluation (Signed)
Anesthesia Evaluation  Patient identified by MRN, date of birth, ID band Patient awake    Reviewed: Allergy & Precautions, NPO status , Patient's Chart, lab work & pertinent test results  History of Anesthesia Complications Negative for: history of anesthetic complications  Airway Mallampati: II  TM Distance: >3 FB Neck ROM: Full    Dental  (+) Dental Advisory Given   Pulmonary neg pulmonary ROS,    breath sounds clear to auscultation       Cardiovascular hypertension (recent HTN, no meds),  Rhythm:Regular Rate:Normal     Neuro/Psych negative neurological ROS     GI/Hepatic negative GI ROS, Neg liver ROS,   Endo/Other  negative endocrine ROS  Renal/GU negative Renal ROS     Musculoskeletal   Abdominal   Peds  Hematology Hb 10.7, plt 236k   Anesthesia Other Findings   Reproductive/Obstetrics (+) Pregnancy                             Anesthesia Physical Anesthesia Plan  ASA: II  Anesthesia Plan: Epidural   Post-op Pain Management:    Induction:   Airway Management Planned: Natural Airway  Additional Equipment:   Intra-op Plan:   Post-operative Plan:   Informed Consent: I have reviewed the patients History and Physical, chart, labs and discussed the procedure including the risks, benefits and alternatives for the proposed anesthesia with the patient or authorized representative who has indicated his/her understanding and acceptance.   Dental advisory given  Plan Discussed with:   Anesthesia Plan Comments: (Patient identified. Risks/Benefits/Options discussed with patient including but not limited to bleeding, infection, nerve damage, paralysis, failed block, incomplete pain control, headache, blood pressure changes, nausea, vomiting, reactions to medication both or allergic, itching and postpartum back pain. Confirmed with bedside nurse the patient's most recent platelet count.  Confirmed with patient that they are not currently taking any anticoagulation, have any bleeding history or any family history of bleeding disorders. Patient expressed understanding and wished to proceed. All questions were answered.  )        Anesthesia Quick Evaluation

## 2016-03-02 NOTE — Anesthesia Pain Management Evaluation Note (Signed)
  CRNA Pain Management Visit Note  Patient: Deanna Duffy, 20 y.o., female  "Hello I am a member of the anesthesia team at [facility]. We have an anesthesia team available at all times to provide care throughout the hospital, including epidural management and anesthesia for C-section. I don't know your plan for the delivery whether it a natural birth, water birth, IV sedation, nitrous supplementation, doula or epidural, but we want to meet your pain goals."   1.Was your pain managed to your expectations on prior hospitalizations?   epidural  2.What is your expectation for pain management during this hospitalization?     yes  3.How can we help you reach that goal? epidural  Record the patient's initial score and the patient's pain goal.   Pain: 0/10  Pain Goal: 0/10 [facility] wants you to be able to say your pain was always managed very well.  Salome ArntSterling, Brookelyn Gaynor Marie 03/02/2016

## 2016-03-02 NOTE — MAU Note (Signed)
Urine sent to lab 

## 2016-03-02 NOTE — MAU Note (Signed)
Pt states she had some leaking of fluid when she was vomiting earlier today. +FM. Pt denies vaginal bleeding. Ctxs are irregular.

## 2016-03-03 ENCOUNTER — Encounter (HOSPITAL_COMMUNITY)
Admission: AD | Disposition: A | Discharge status: Home / Self Care | Payer: Self-pay | Source: Ambulatory Visit | Attending: Obstetrics & Gynecology

## 2016-03-03 ENCOUNTER — Encounter (HOSPITAL_COMMUNITY): Payer: Self-pay | Admitting: Emergency Medicine

## 2016-03-03 DIAGNOSIS — Z98891 History of uterine scar from previous surgery: Secondary | ICD-10-CM

## 2016-03-03 DIAGNOSIS — Z3A39 39 weeks gestation of pregnancy: Secondary | ICD-10-CM

## 2016-03-03 LAB — CBC
HCT: 32.6 % — ABNORMAL LOW (ref 36.0–46.0)
Hemoglobin: 10.9 g/dL — ABNORMAL LOW (ref 12.0–15.0)
MCH: 28 pg (ref 26.0–34.0)
MCHC: 33.4 g/dL (ref 30.0–36.0)
MCV: 83.8 fL (ref 78.0–100.0)
PLATELETS: 193 10*3/uL (ref 150–400)
RBC: 3.89 MIL/uL (ref 3.87–5.11)
RDW: 14.8 % (ref 11.5–15.5)
WBC: 18.6 10*3/uL — ABNORMAL HIGH (ref 4.0–10.5)

## 2016-03-03 LAB — RPR: RPR: NONREACTIVE

## 2016-03-03 LAB — MAGNESIUM: MAGNESIUM: 5.5 mg/dL — AB (ref 1.7–2.4)

## 2016-03-03 SURGERY — Surgical Case
Anesthesia: Epidural

## 2016-03-03 MED ORDER — WITCH HAZEL-GLYCERIN EX PADS: 1.0000 "application " | MEDICATED_PAD | CUTANEOUS | Status: DC | PRN

## 2016-03-03 MED ORDER — BUPIVACAINE HCL (PF) 0.5 % IJ SOLN
INTRAMUSCULAR | Status: AC
  Filled 2016-03-03: qty 30

## 2016-03-03 MED ORDER — ONDANSETRON HCL 4 MG/2ML IJ SOLN: 4.0000 mg | Freq: Three times a day (TID) | INTRAMUSCULAR | Status: DC | PRN

## 2016-03-03 MED ORDER — PRENATAL MULTIVITAMIN CH
1.0000 | ORAL_TABLET | Freq: Every day | ORAL | Status: DC
  Administered 2016-03-03 – 2016-03-06 (×4): 1 via ORAL
  Filled 2016-03-03 (×4): qty 1

## 2016-03-03 MED ORDER — ACETAMINOPHEN 500 MG PO TABS: 1000.0000 mg | ORAL_TABLET | Freq: Four times a day (QID) | ORAL | Status: AC

## 2016-03-03 MED ORDER — SIMETHICONE 80 MG PO CHEW
80.0000 mg | CHEWABLE_TABLET | ORAL | Status: DC | PRN
  Administered 2016-03-03 – 2016-03-05 (×3): 80 mg via ORAL
  Filled 2016-03-03 (×2): qty 1

## 2016-03-03 MED ORDER — SCOPOLAMINE 1 MG/3DAYS TD PT72
MEDICATED_PATCH | TRANSDERMAL | Status: AC
  Filled 2016-03-03: qty 1

## 2016-03-03 MED ORDER — SENNOSIDES-DOCUSATE SODIUM 8.6-50 MG PO TABS
2.0000 | ORAL_TABLET | ORAL | Status: DC
  Administered 2016-03-04 – 2016-03-06 (×4): 2 via ORAL
  Filled 2016-03-03 (×4): qty 2

## 2016-03-03 MED ORDER — NALBUPHINE HCL 10 MG/ML IJ SOLN: 5.0000 mg | INTRAMUSCULAR | Status: DC | PRN

## 2016-03-03 MED ORDER — LACTATED RINGERS IV SOLN
2.0000 g/h | INTRAVENOUS | Status: DC
  Administered 2016-03-03: 2 g/h via INTRAVENOUS
  Filled 2016-03-03: qty 80

## 2016-03-03 MED ORDER — FENTANYL CITRATE (PF) 100 MCG/2ML IJ SOLN
25.0000 ug | INTRAMUSCULAR | Status: DC | PRN
  Administered 2016-03-03: 25 ug via INTRAVENOUS

## 2016-03-03 MED ORDER — LACTATED RINGERS IV SOLN
INTRAVENOUS | Status: DC
  Administered 2016-03-03 – 2016-03-04 (×2): via INTRAVENOUS

## 2016-03-03 MED ORDER — IBUPROFEN 600 MG PO TABS
600.0000 mg | ORAL_TABLET | Freq: Four times a day (QID) | ORAL | Status: DC
  Administered 2016-03-03 – 2016-03-04 (×3): 600 mg via ORAL
  Filled 2016-03-03 (×3): qty 1

## 2016-03-03 MED ORDER — FERROUS SULFATE 325 (65 FE) MG PO TABS
325.0000 mg | ORAL_TABLET | Freq: Two times a day (BID) | ORAL | Status: DC
  Administered 2016-03-03 – 2016-03-07 (×7): 325 mg via ORAL
  Filled 2016-03-03 (×7): qty 1

## 2016-03-03 MED ORDER — KETOROLAC TROMETHAMINE 30 MG/ML IJ SOLN
30.0000 mg | Freq: Four times a day (QID) | INTRAMUSCULAR | Status: AC | PRN
  Administered 2016-03-03: 30 mg via INTRAMUSCULAR
  Filled 2016-03-03: qty 1

## 2016-03-03 MED ORDER — BUPIVACAINE-EPINEPHRINE (PF) 0.5% -1:200000 IJ SOLN
INTRAMUSCULAR | Status: AC
  Filled 2016-03-03: qty 30

## 2016-03-03 MED ORDER — OXYCODONE-ACETAMINOPHEN 5-325 MG PO TABS
1.0000 | ORAL_TABLET | ORAL | Status: DC | PRN
  Administered 2016-03-04 (×3): 1 via ORAL
  Filled 2016-03-03 (×3): qty 1

## 2016-03-03 MED ORDER — IBUPROFEN 600 MG PO TABS: 600.0000 mg | ORAL_TABLET | Freq: Four times a day (QID) | ORAL | Status: DC | PRN

## 2016-03-03 MED ORDER — NALOXONE HCL 2 MG/2ML IJ SOSY: 1.0000 ug/kg/h | PREFILLED_SYRINGE | INTRAMUSCULAR | Status: DC | PRN

## 2016-03-03 MED ORDER — NALBUPHINE HCL 10 MG/ML IJ SOLN: 5.0000 mg | Freq: Once | INTRAMUSCULAR | Status: DC | PRN

## 2016-03-03 MED ORDER — MAGNESIUM HYDROXIDE 400 MG/5ML PO SUSP: 30.0000 mL | ORAL | Status: DC | PRN

## 2016-03-03 MED ORDER — ACETAMINOPHEN 325 MG PO TABS
650.0000 mg | ORAL_TABLET | ORAL | Status: DC | PRN
Start: 2016-03-03 — End: 2016-03-07

## 2016-03-03 MED ORDER — OXYCODONE-ACETAMINOPHEN 5-325 MG PO TABS
2.0000 | ORAL_TABLET | ORAL | Status: DC | PRN
  Administered 2016-03-04 – 2016-03-07 (×11): 2 via ORAL
  Filled 2016-03-03 (×11): qty 2

## 2016-03-03 MED ORDER — FENTANYL CITRATE (PF) 100 MCG/2ML IJ SOLN
INTRAMUSCULAR | Status: DC | PRN
  Administered 2016-03-03: 50 ug via INTRAVENOUS

## 2016-03-03 MED ORDER — MEASLES, MUMPS & RUBELLA VAC ~~LOC~~ INJ
0.5000 mL | INJECTION | Freq: Once | SUBCUTANEOUS | Status: DC
  Filled 2016-03-03: qty 0.5

## 2016-03-03 MED ORDER — MENTHOL 3 MG MT LOZG
1.0000 | LOZENGE | OROMUCOSAL | Status: DC | PRN
  Filled 2016-03-03: qty 9

## 2016-03-03 MED ORDER — DIBUCAINE 1 % RE OINT: 1.0000 "application " | TOPICAL_OINTMENT | RECTAL | Status: DC | PRN

## 2016-03-03 MED ORDER — KETOROLAC TROMETHAMINE 30 MG/ML IJ SOLN
INTRAMUSCULAR | Status: AC
  Filled 2016-03-03: qty 1

## 2016-03-03 MED ORDER — TERBUTALINE SULFATE 1 MG/ML IJ SOLN
0.2500 mg | Freq: Once | INTRAMUSCULAR | Status: AC
  Administered 2016-03-03: 0.25 mg via SUBCUTANEOUS

## 2016-03-03 MED ORDER — FENTANYL CITRATE (PF) 100 MCG/2ML IJ SOLN
INTRAMUSCULAR | Status: AC
  Filled 2016-03-03: qty 2

## 2016-03-03 MED ORDER — OXYTOCIN 40 UNITS IN LACTATED RINGERS INFUSION - SIMPLE MED
1.0000 m[IU]/min | INTRAVENOUS | Status: DC
  Administered 2016-03-03: 2 m[IU]/min via INTRAVENOUS

## 2016-03-03 MED ORDER — PHENYLEPHRINE HCL 10 MG/ML IJ SOLN
INTRAMUSCULAR | Status: DC | PRN
  Administered 2016-03-03: 40 ug via INTRAVENOUS

## 2016-03-03 MED ORDER — CEFAZOLIN SODIUM-DEXTROSE 2-3 GM-% IV SOLR
INTRAVENOUS | Status: DC | PRN
  Administered 2016-03-03: 2 g via INTRAVENOUS

## 2016-03-03 MED ORDER — OXYTOCIN 40 UNITS IN LACTATED RINGERS INFUSION - SIMPLE MED: 2.5000 [IU]/h | INTRAVENOUS | Status: AC

## 2016-03-03 MED ORDER — LIDOCAINE-EPINEPHRINE (PF) 2 %-1:200000 IJ SOLN
INTRAMUSCULAR | Status: DC | PRN
  Administered 2016-03-03: 5 mL via INTRADERMAL
  Administered 2016-03-03: 5 mL
  Administered 2016-03-03: 1 mL via INTRADERMAL
  Administered 2016-03-03: 2 mL via INTRADERMAL
  Administered 2016-03-03: 5 mL

## 2016-03-03 MED ORDER — NALOXONE HCL 0.4 MG/ML IJ SOLN: 0.4000 mg | INTRAMUSCULAR | Status: DC | PRN

## 2016-03-03 MED ORDER — COCONUT OIL OIL: 1.0000 "application " | TOPICAL_OIL | Status: DC | PRN

## 2016-03-03 MED ORDER — OXYTOCIN 10 UNIT/ML IJ SOLN
INTRAMUSCULAR | Status: AC
  Filled 2016-03-03: qty 4

## 2016-03-03 MED ORDER — DIPHENHYDRAMINE HCL 50 MG/ML IJ SOLN: 12.5000 mg | INTRAMUSCULAR | Status: DC | PRN

## 2016-03-03 MED ORDER — OXYTOCIN 10 UNIT/ML IJ SOLN
40.0000 [IU] | INTRAVENOUS | Status: DC | PRN
  Administered 2016-03-03: 40 [IU] via INTRAVENOUS

## 2016-03-03 MED ORDER — SIMETHICONE 80 MG PO CHEW
80.0000 mg | CHEWABLE_TABLET | ORAL | Status: DC
Start: 2016-03-04 — End: 2016-03-07
  Administered 2016-03-04 – 2016-03-06 (×4): 80 mg via ORAL
  Filled 2016-03-03 (×5): qty 1

## 2016-03-03 MED ORDER — SODIUM CHLORIDE 0.9% FLUSH: 3.0000 mL | INTRAVENOUS | Status: DC | PRN

## 2016-03-03 MED ORDER — LIDOCAINE HCL 2 % EX GEL
Freq: Once | CUTANEOUS | Status: DC
  Filled 2016-03-03: qty 5

## 2016-03-03 MED ORDER — DEXAMETHASONE SODIUM PHOSPHATE 4 MG/ML IJ SOLN
INTRAMUSCULAR | Status: AC
  Filled 2016-03-03: qty 1

## 2016-03-03 MED ORDER — MEPERIDINE HCL 25 MG/ML IJ SOLN: 6.2500 mg | INTRAMUSCULAR | Status: DC | PRN

## 2016-03-03 MED ORDER — MORPHINE SULFATE (PF) 0.5 MG/ML IJ SOLN
INTRAMUSCULAR | Status: AC
Start: 2016-03-03 — End: 2016-03-03
  Filled 2016-03-03: qty 10

## 2016-03-03 MED ORDER — KETOROLAC TROMETHAMINE 30 MG/ML IJ SOLN
30.0000 mg | Freq: Four times a day (QID) | INTRAMUSCULAR | Status: AC | PRN
  Filled 2016-03-03: qty 1

## 2016-03-03 MED ORDER — SCOPOLAMINE 1 MG/3DAYS TD PT72
MEDICATED_PATCH | TRANSDERMAL | Status: DC | PRN
  Administered 2016-03-03: 1 via TRANSDERMAL

## 2016-03-03 MED ORDER — LACTATED RINGERS IV SOLN
INTRAVENOUS | Status: DC | PRN
  Administered 2016-03-03 (×2): via INTRAVENOUS

## 2016-03-03 MED ORDER — DIPHENHYDRAMINE HCL 25 MG PO CAPS
25.0000 mg | ORAL_CAPSULE | ORAL | Status: DC | PRN
  Filled 2016-03-03: qty 1

## 2016-03-03 MED ORDER — BUPIVACAINE HCL (PF) 0.5 % IJ SOLN
INTRAMUSCULAR | Status: DC | PRN
  Administered 2016-03-03: 30 mL

## 2016-03-03 MED ORDER — ONDANSETRON HCL 4 MG/2ML IJ SOLN
INTRAMUSCULAR | Status: DC | PRN
  Administered 2016-03-03: 4 mg via INTRAVENOUS

## 2016-03-03 MED ORDER — TERBUTALINE SULFATE 1 MG/ML IJ SOLN
0.2500 mg | Freq: Once | INTRAMUSCULAR | Status: AC | PRN
  Administered 2016-03-03: 0.25 mg via SUBCUTANEOUS
  Filled 2016-03-03: qty 1

## 2016-03-03 MED ORDER — DIPHENHYDRAMINE HCL 25 MG PO CAPS: 25.0000 mg | ORAL_CAPSULE | Freq: Four times a day (QID) | ORAL | Status: DC | PRN

## 2016-03-03 MED ORDER — DEXAMETHASONE SODIUM PHOSPHATE 4 MG/ML IJ SOLN
INTRAMUSCULAR | Status: DC | PRN
Start: 2016-03-03 — End: 2016-03-03
  Administered 2016-03-03: 4 mg via INTRAVENOUS

## 2016-03-03 MED ORDER — ONDANSETRON HCL 4 MG/2ML IJ SOLN
INTRAMUSCULAR | Status: AC
  Filled 2016-03-03: qty 2

## 2016-03-03 MED ORDER — ZOLPIDEM TARTRATE 5 MG PO TABS: 5.0000 mg | ORAL_TABLET | Freq: Every evening | ORAL | Status: DC | PRN

## 2016-03-03 MED ORDER — TETANUS-DIPHTH-ACELL PERTUSSIS 5-2.5-18.5 LF-MCG/0.5 IM SUSP
0.5000 mL | Freq: Once | INTRAMUSCULAR | Status: AC
  Administered 2016-03-05: 0.5 mL via INTRAMUSCULAR
  Filled 2016-03-03: qty 0.5

## 2016-03-03 MED ORDER — MORPHINE SULFATE (PF) 0.5 MG/ML IJ SOLN
INTRAMUSCULAR | Status: DC | PRN
  Administered 2016-03-03: 4 mg via EPIDURAL

## 2016-03-03 MED ORDER — OXYTOCIN 40 UNITS IN LACTATED RINGERS INFUSION - SIMPLE MED: 1.0000 m[IU]/min | INTRAVENOUS | Status: DC

## 2016-03-03 SURGICAL SUPPLY — 32 items
CHLORAPREP W/TINT 26ML (MISCELLANEOUS) ×3 IMPLANT
CLAMP CORD UMBIL (MISCELLANEOUS) IMPLANT
CLOTH BEACON ORANGE TIMEOUT ST (SAFETY) ×3 IMPLANT
DRSG OPSITE POSTOP 4X10 (GAUZE/BANDAGES/DRESSINGS) ×3 IMPLANT
ELECT REM PT RETURN 9FT ADLT (ELECTROSURGICAL) ×3
ELECTRODE REM PT RTRN 9FT ADLT (ELECTROSURGICAL) ×1 IMPLANT
EXTRACTOR VACUUM M CUP 4 TUBE (SUCTIONS) IMPLANT
EXTRACTOR VACUUM M CUP 4' TUBE (SUCTIONS)
GLOVE BIOGEL PI IND STRL 7.0 (GLOVE) ×3 IMPLANT
GLOVE BIOGEL PI INDICATOR 7.0 (GLOVE) ×6
GLOVE ECLIPSE 7.0 STRL STRAW (GLOVE) ×3 IMPLANT
GOWN STRL REUS W/TWL LRG LVL3 (GOWN DISPOSABLE) ×6 IMPLANT
KIT ABG SYR 3ML LUER SLIP (SYRINGE) IMPLANT
NEEDLE HYPO 22GX1.5 SAFETY (NEEDLE) ×3 IMPLANT
NEEDLE HYPO 25X5/8 SAFETYGLIDE (NEEDLE) ×3 IMPLANT
NS IRRIG 1000ML POUR BTL (IV SOLUTION) ×3 IMPLANT
PACK C SECTION WH (CUSTOM PROCEDURE TRAY) ×3 IMPLANT
PAD ABD 7.5X8 STRL (GAUZE/BANDAGES/DRESSINGS) ×3 IMPLANT
PAD ABD 8X7 1/2 STERILE (GAUZE/BANDAGES/DRESSINGS) ×3 IMPLANT
PAD OB MATERNITY 4.3X12.25 (PERSONAL CARE ITEMS) ×3 IMPLANT
PENCIL SMOKE EVAC W/HOLSTER (ELECTROSURGICAL) ×3 IMPLANT
RTRCTR C-SECT PINK 25CM LRG (MISCELLANEOUS) IMPLANT
SPONGE GAUZE 4X4 16PLY NONSTR (GAUZE/BANDAGES/DRESSINGS) ×3 IMPLANT
SUT PDS AB 0 CTX 36 PDP370T (SUTURE) ×3 IMPLANT
SUT PLAIN 2 0 XLH (SUTURE) IMPLANT
SUT VIC AB 0 CTX 36 (SUTURE) ×6
SUT VIC AB 0 CTX36XBRD ANBCTRL (SUTURE) ×3 IMPLANT
SUT VIC AB 4-0 KS 27 (SUTURE) ×3 IMPLANT
SYR CONTROL 10ML LL (SYRINGE) ×3 IMPLANT
TAPE MEDIFIX FOAM 3 (GAUZE/BANDAGES/DRESSINGS) ×3 IMPLANT
TOWEL OR 17X24 6PK STRL BLUE (TOWEL DISPOSABLE) ×3 IMPLANT
TRAY FOLEY CATH SILVER 14FR (SET/KITS/TRAYS/PACK) ×3 IMPLANT

## 2016-03-03 NOTE — Progress Notes (Signed)
Epidural catheter dcd. Catheter tip intact

## 2016-03-03 NOTE — Anesthesia Postprocedure Evaluation (Signed)
Anesthesia Post Note  Patient: Deanna Duffy  Procedure(s) Performed: Procedure(s) (LRB): CESAREAN SECTION (N/A)  Patient location during evaluation: PACU Anesthesia Type: Epidural Level of consciousness: awake and alert and oriented Pain management: pain level controlled Vital Signs Assessment: post-procedure vital signs reviewed and stable Respiratory status: nonlabored ventilation, respiratory function stable and spontaneous breathing Cardiovascular status: blood pressure returned to baseline and stable Postop Assessment: no headache, no backache, epidural receding, patient able to bend at knees and no signs of nausea or vomiting Anesthetic complications: no                  Kirstyn Lean A.

## 2016-03-03 NOTE — Transfer of Care (Signed)
Immediate Anesthesia Transfer of Care Note  Patient: Senaida OresSharlaye Dimario  Procedure(s) Performed: Procedure(s): CESAREAN SECTION (N/A)  Patient Location: PACU  Anesthesia Type:Epidural  Level of Consciousness: awake and alert   Airway & Oxygen Therapy: Patient Spontanous Breathing and Patient connected to nasal cannula oxygen  Post-op Assessment: Report given to RN and Post -op Vital signs reviewed and stable  Post vital signs: Reviewed  Last Vitals:  Filed Vitals:   03/03/16 0532 03/03/16 0543  BP: 150/85 148/84  Pulse: 102 97  Temp: 36.4 C   Resp: 20     Last Pain:  Filed Vitals:   03/03/16 0700  PainSc: 0-No pain      Patients Stated Pain Goal: 7 (03/02/16 2014)  Complications: No apparent anesthesia complications

## 2016-03-03 NOTE — Progress Notes (Signed)
Patient ID: Deanna Duffy, female   DOB: 10/06/1995, 20 y.o.   MRN: [id number] Deanna Duffy is a 20 y.o. G2P0010 at 644w6d.  Subjective: Comfortable w/ epidural except for catheter pain.  Objective: BP 150/94 mmHg  Pulse 78  Temp(Src) 97.8 F (36.6 C) (Oral)  Resp 20  Ht 5\' 1"  (1.549 m)  Wt 154 lb (69.854 kg)  BMI 29.11 kg/m2  SpO2 99%  LMP 05/20/2015   FHT:  FHR: 120 bpm, variability: mod,  accelerations:  15x15,  decelerations:  Repetitive variables UC:   Q 2-4 minutes, mod. Pitocin on 2. Dilation: 6 Effacement (%): 90 Cervical Position: Middle Station: -2 Presentation: Vertex Exam by:: v.Alyza Artiaga,cnm IUPC and FSE placed w/ out difficulty. Bloody return in IUPC.   Labs: Results for orders placed or performed during the hospital encounter of 03/02/16 (from the past 24 hour(s))  Protein / creatinine ratio, urine     Status: Abnormal   Collection Time: 03/02/16  7:00 PM  Result Value Ref Range   Creatinine, Urine 139.00 mg/dL   Total Protein, Urine 25 mg/dL   Protein Creatinine Ratio 0.18 (H) 0.00 - 0.15 mg/mg[Cre]  CBC     Status: Abnormal   Collection Time: 03/02/16  7:53 PM  Result Value Ref Range   WBC 13.0 (H) 4.0 - 10.5 K/uL   RBC 3.81 (L) 3.87 - 5.11 MIL/uL   Hemoglobin 10.7 (L) 12.0 - 15.0 g/dL   HCT 14.731.7 (L) 82.936.0 - 56.246.0 %   MCV 83.2 78.0 - 100.0 fL   MCH 28.1 26.0 - 34.0 pg   MCHC 33.8 30.0 - 36.0 g/dL   RDW 13.014.7 86.511.5 - 78.415.5 %   Platelets 236 150 - 400 K/uL  Type and screen [facility] [place]     Status: None   Collection Time: 03/02/16  7:53 PM  Result Value Ref Range   ABO/RH(D) O POS    Antibody Screen NEG    Sample Expiration 03/05/2016   Comprehensive metabolic panel     Status: Abnormal   Collection Time: 03/02/16  7:53 PM  Result Value Ref Range   Sodium 135 135 - 145 mmol/L   Potassium 3.8 3.5 - 5.1 mmol/L   Chloride 107 101 - 111 mmol/L   CO2 19 (L) 22 - 32 mmol/L   Glucose, Bld 81 65 - 99 mg/dL   BUN 12 6 - 20 mg/dL   Creatinine, Ser 6.960.81 0.44 - 1.00 mg/dL   Calcium 9.1 8.9 - 29.510.3 mg/dL   Total Protein 7.1 6.5 - 8.1 g/dL   Albumin 3.2 (L) 3.5 - 5.0 g/dL   AST 17 15 - 41 U/L   ALT 14 14 - 54 U/L   Alkaline Phosphatase 195 (H) 38 - 126 U/L   Total Bilirubin 0.3 0.3 - 1.2 mg/dL   GFR calc non Af Amer >60 >60 mL/min   GFR calc Af Amer >60 >60 mL/min   Anion gap 9 5 - 15    Assessment / Plan: 644w6d week IUP Labor: active, likely inadequate labor pattern Fetal Wellbeing:  Category II, but overall reassuring Pain Control:  Epidural Anticipated MOD:  SVD Start amnioinfusion Lidocaine jelly for catheter pain D/C pitocin. May restart when variables improve.   [name], CNM 03/03/2016 3:07 AM

## 2016-03-03 NOTE — Addendum Note (Signed)
Addendum  created 03/03/16 1353 by Shanon PayorSuzanne M Calleigh Lafontant, CRNA   Modules edited: Clinical Notes   Clinical Notes:  File: [id number]

## 2016-03-03 NOTE — Lactation Note (Signed)
This note was copied from a baby's chart. Lactation Consultation Note  Patient Name: Deanna Duffy Swiss ZOXWR'UToday's Date: 03/03/2016 Reason for consult: Initial assessment  Baby 15 hours old. Mom reports that she has just finished feeding the baby 30 ml of formula by bottle. Mom states that she wants to breastfeed, and would like to nurse for the first year. Mom also states that [facility] for a [facility]. Discussed supply and demand, and the progression of milk coming to volume. Also, discussed with mom that the baby will probably not be hungry again for a while since she has already had an ounce of formula. So, gave mom a manual pump with review for additional stimulation of the breast. Mom reports that she did pace the feeding of formula, burping the baby halfway through the feeding. Enc mom to call out for assistance with feeding the baby as needed.  [facility], aware of OP/BFSG and LC phone line assistance after D/C.  Maternal Data Has patient been taught Hand Expression?: Yes (Per mom.) Does the patient have breastfeeding experience prior to this delivery?: No  Feeding Feeding Type: Formula Length of feed: 20 min  LATCH Score/Interventions Latch: Grasps breast easily, tongue down, lips flanged, rhythmical sucking. Intervention(s): Waking techniques  Audible Swallowing: A few with stimulation Intervention(s): Skin to skin Intervention(s): Hand expression  Type of Nipple: Everted at rest and after stimulation  Comfort (Breast/Nipple): Soft / non-tender     Hold (Positioning): Assistance needed to correctly position infant at breast and maintain latch. Intervention(s): Support Pillows  LATCH Score: 8  Lactation Tools Discussed/Used Pump Review: Setup, frequency, and cleaning;Milk Storage Initiated by:: JW Date initiated:: 03/03/16   Consult Status Consult Status: Follow-up Date: 03/04/16 Follow-up type: In-patient    Geralynn OchsWILLIARD,  [name] 03/03/2016, 9:58 PM

## 2016-03-03 NOTE — Progress Notes (Signed)
Patient ID: Deanna Duffy, female   DOB: 1995-08-28, 20 y.o.   MRN: [id number] Deanna Duffy is a 20 y.o. G2P0010 at [redacted]w[redacted]d.  Subjective: Called to evaluate patient with repetitive decelerations; fetal is unable to tolerate pitocin.  Patient  Is comfortable with epidural.  Objective: BP 153/92 mmHg  Pulse 127  Temp(Src) 97.5 F (36.4 C) (Oral)  Resp 20  Ht  (1.549 m)  Wt 154 lb (69.854 kg)  BMI 29.11 kg/m2  SpO2 99%  LMP 05/20/2015   FHT:  FHR: 120 bpm, variability: mod,  accelerations:  15x15,  decelerations:  Repetitive variables UC:   Q 2-4 minutes, mod. Pitocin off  Dilation: 6.5 Effacement (%): 90 Cervical Position: Middle Station: 0 Presentation: Vertex Exam by:: katie forsell,rnc No cervical change since midnight.  Labs: Results for orders placed or performed during the hospital encounter of 03/02/16 (from the past 24 hour(s))  Protein / creatinine ratio, urine     Status: Abnormal   Collection Time: 03/02/16  7:00 PM  Result Value Ref Range   Creatinine, Urine 139.00 mg/dL   Total Protein, Urine 25 mg/dL   Protein Creatinine Ratio 0.18 (H) 0.00 - 0.15 mg/mg[Cre]  CBC     Status: Abnormal   Collection Time: 03/02/16  7:53 PM  Result Value Ref Range   WBC 13.0 (H) 4.0 - 10.5 K/uL   RBC 3.81 (L) 3.87 - 5.11 MIL/uL   Hemoglobin 10.7 (L) 12.0 - 15.0 g/dL   HCT 75.6 (L) 43.3 - 29.5 %   MCV 83.2 78.0 - 100.0 fL   MCH 28.1 26.0 - 34.0 pg   MCHC 33.8 30.0 - 36.0 g/dL   RDW 18.8 41.6 - 60.6 %   Platelets 236 150 - 400 K/uL  Type and screen [facility] [place]     Status: None   Collection Time: 03/02/16  7:53 PM  Result Value Ref Range   ABO/RH(D) O POS    Antibody Screen NEG    Sample Expiration 03/05/2016   Comprehensive metabolic panel     Status: Abnormal   Collection Time: 03/02/16  7:53 PM  Result Value Ref Range   Sodium 135 135 - 145 mmol/L   Potassium 3.8 3.5 - 5.1 mmol/L   Chloride 107 101 - 111 mmol/L   CO2 19 (L) 22 - 32 mmol/L   Glucose, Bld 81 65 - 99 mg/dL   BUN 12 6 - 20 mg/dL   Creatinine, Ser 3.01 0.44 - 1.00 mg/dL   Calcium 9.1 8.9 - 60.1 mg/dL   Total Protein 7.1 6.5 - 8.1 g/dL   Albumin 3.2 (L) 3.5 - 5.0 g/dL   AST 17 15 - 41 U/L   ALT 14 14 - 54 U/L   Alkaline Phosphatase 195 (H) 38 - 126 U/L   Total Bilirubin 0.3 0.3 - 1.2 mg/dL   GFR calc non Af Amer >60 >60 mL/min   GFR calc Af Amer >60 >60 mL/min   Anion gap 9 5 - 15    Assessment / Plan: [redacted]w[redacted]d week IUP, fetal intolerance of labor, severe preeclampsia on magnesium sulfate. Patient had no episodes of deep variable FHR decelerations with a pitocin of 2 u/min or less.  She is s/p turning off pitocin again and administration of terbutaline.  Have not been able to get adequate contractions because of this, and no subsequent cervical change in about 6 hours.  Currently, she is still having concerning variables with spontaneous decelerations. Cesarean delivery was recommended.  The  risks of cesarean section discussed with the patient included but were not limited to: bleeding which may require transfusion or reoperation; infection which may require antibiotics; injury to bowel, bladder, ureters or other surrounding organs; injury to the fetus; need for additional procedures including hysterectomy in the event of a life-threatening hemorrhage; placental abnormalities wth subsequent pregnancies, incisional problems, thromboembolic phenomenon and other postoperative/anesthesia complications. The patient concurred with the proposed plan, giving informed written consent for the procedure.   Anesthesia and OR aware. Preoperative prophylactic antibiotics and SCDs ordered on call to the OR.  To OR when ready.   Tereso NewcomerUgonna A Cloee Dunwoody, MD 03/03/2016 5:47 AM

## 2016-03-03 NOTE — Op Note (Signed)
Deanna OresSharlaye Shawgo PROCEDURE DATE: 03/03/2016  PREOPERATIVE DIAGNOSES: Intrauterine pregnancy at 6338w6d weeks gestation; non-reassuring fetal status and fetal intolerance of labor  POSTOPERATIVE DIAGNOSES: The same  PROCEDURE: Primary Low Transverse Cesarean Section  SURGEON:  Dr. Jaynie CollinsUgonna Rahil Passey  ANESTHESIOLOGIST: Dr. Jairo Benarswell Jackson  INDICATIONS: Deanna Duffy is a 20 y.o. G2P1011 at 6238w6d here for cesarean section secondary to the indications listed under preoperative diagnoses; please see preoperative note for further details.  The risks of cesarean section were discussed with the patient including but were not limited to: bleeding which may require transfusion or reoperation; infection which may require antibiotics; injury to bowel, bladder, ureters or other surrounding organs; injury to the fetus; need for additional procedures including hysterectomy in the event of a life-threatening hemorrhage; placental abnormalities wth subsequent pregnancies, incisional problems, thromboembolic phenomenon and other postoperative/anesthesia complications.   The patient concurred with the proposed plan, giving informed written consent for the procedure.    FINDINGS:  Viable female infant in cephalic presentation.  Apgars 9 and 9. Tight nuchal cord x 1.  Meconium stained amniotic fluid.  Intact placenta, three vessel cord.  Normal uterus, fallopian tubes and ovaries bilaterally.  ANESTHESIA: Epidural INTRAVENOUS FLUIDS: 500 ml ESTIMATED BLOOD LOSS: 500 ml URINE OUTPUT:  150 ml SPECIMENS: Placenta sent to L&D COMPLICATIONS: None immediate  PROCEDURE IN DETAIL:  The patient preoperatively received intravenous antibiotics and had sequential compression devices applied to her lower extremities.  She was then taken to the operating room where the epidural anesthesia was dosed up to surgical level and was found to be adequate. She was then placed in a dorsal supine position with a leftward tilt, and prepped and  draped in a sterile manner.  A foley catheter was placed into her bladder and attached to constant gravity.  After an adequate timeout was performed, a Pfannenstiel skin incision was made with scalpel and carried through to the underlying layer of fascia. The fascia was incised in the midline, and this incision was extended bilaterally using the Mayo scissors.  Kocher clamps were applied to the superior aspect of the fascial incision and the underlying rectus muscles were dissected off bluntly.  A similar process was carried out on the inferior aspect of the fascial incision. The rectus muscles were separated in the midline bluntly and the peritoneum was entered bluntly. Attention was turned to the lower uterine segment where a low transverse hysterotomy was made with a scalpel and extended bilaterally bluntly.  The infant was successfully delivered, the cord was clamped and cut after one minute, and the infant was handed over to the awaiting neonatology team. Uterine massage was then administered, and the placenta delivered intact with a three-vessel cord. The uterus was then cleared of clot and debris.  The hysterotomy was closed with 0 Vicryl in a running locked fashion, and an imbricating layer was also placed with 0 Vicryl.  The pelvis was cleared of all clot and debris. Hemostasis was confirmed on all surfaces.  The peritoneum and the muscles were reapproximated using a 0 Vicryl running stitches. The fascia was then closed using 0 Vicryl in a running fashion.  The subcutaneous layer was irrigated, and 30 ml of 0.5% Marcaine was injected subcutaneously around the incision.  The skin was closed with a 4-0 Vicryl subcuticular stitch. The patient tolerated the procedure well. Sponge, lap, instrument and needle counts were correct x 3.  She was taken to the recovery room in stable condition.    Jaynie CollinsUGONNA  Joyceann Kruser, MD, FACOG Attending  Obstetrician & Microbiologist, [facility]

## 2016-03-03 NOTE — Anesthesia Postprocedure Evaluation (Signed)
Anesthesia Post Note  Patient: Deanna Duffy  Procedure(s) Performed: Procedure(s) (LRB): CESAREAN SECTION (N/A)  Patient location during evaluation: Mother Baby Anesthesia Type: Epidural Level of consciousness: awake and alert and oriented Pain management: pain level controlled Vital Signs Assessment: post-procedure vital signs reviewed and stable Respiratory status: spontaneous breathing and nonlabored ventilation Cardiovascular status: stable Postop Assessment: no headache, no backache, patient able to bend at knees, no signs of nausea or vomiting and adequate PO intake Anesthetic complications: no     Last Vitals:  Filed Vitals:   03/03/16 1234 03/03/16 1239  BP:    Pulse: 79 75  Temp:    Resp:      Last Pain:  Filed Vitals:   03/03/16 1241  PainSc: 4    Pain Goal: Patients Stated Pain Goal: 4 (03/03/16 0925)               Madison HickmanGREGORY,Emi Lymon

## 2016-03-04 LAB — COMPREHENSIVE METABOLIC PANEL
ALBUMIN: 2.5 g/dL — AB (ref 3.5–5.0)
ALT: 24 U/L (ref 14–54)
AST: 24 U/L (ref 15–41)
Alkaline Phosphatase: 139 U/L — ABNORMAL HIGH (ref 38–126)
Anion gap: 5 (ref 5–15)
BUN: 6 mg/dL (ref 6–20)
CHLORIDE: 104 mmol/L (ref 101–111)
CO2: 26 mmol/L (ref 22–32)
CREATININE: 0.78 mg/dL (ref 0.44–1.00)
Calcium: 7.2 mg/dL — ABNORMAL LOW (ref 8.9–10.3)
GFR calc Af Amer: >60 mL/min (ref >60)
GFR calc non Af Amer: >60 mL/min (ref >60)
Glucose, Bld: 94 mg/dL (ref 65–99)
POTASSIUM: 3.8 mmol/L (ref 3.5–5.1)
SODIUM: 135 mmol/L (ref 135–145)
Total Bilirubin: 0.2 mg/dL — ABNORMAL LOW (ref 0.3–1.2)
Total Protein: 5.8 g/dL — ABNORMAL LOW (ref 6.5–8.1)

## 2016-03-04 LAB — CBC
HCT: 27.2 % — ABNORMAL LOW (ref 36.0–46.0)
Hemoglobin: 9.1 g/dL — ABNORMAL LOW (ref 12.0–15.0)
MCH: 27.7 pg (ref 26.0–34.0)
MCHC: 33.5 g/dL (ref 30.0–36.0)
MCV: 82.9 fL (ref 78.0–100.0)
PLATELETS: 185 10*3/uL (ref 150–400)
RBC: 3.28 MIL/uL — AB (ref 3.87–5.11)
RDW: 14.8 % (ref 11.5–15.5)
WBC: 19.6 10*3/uL — AB (ref 4.0–10.5)

## 2016-03-04 MED ORDER — SODIUM CHLORIDE 0.9% FLUSH: 3.0000 mL | INTRAVENOUS | Status: DC | PRN

## 2016-03-04 MED ORDER — SODIUM CHLORIDE 0.9% FLUSH
3.0000 mL | Freq: Two times a day (BID) | INTRAVENOUS | Status: DC
  Administered 2016-03-04: 3 mL via INTRAVENOUS

## 2016-03-04 NOTE — Progress Notes (Signed)
Subjective: Postpartum Day 1: Cesarean Delivery Patient reports tolerating PO.    Objective: Vital signs in last 24 hours: Temp:  [94.4 F (34.7 C)-98.1 F (36.7 C)] 98 F (36.7 C) (07/17 0405) Pulse Rate:  [68-117] 89 (07/17 0609) Resp:  [9-25] 18 (07/17 0605) BP: (110-155)/(58-95) 127/82 mmHg (07/17 0605) SpO2:  [93 %-100 %] 100 % (07/17 0609)  Physical Exam:  General: alert, cooperative and no distress Lochia: appropriate Uterine Fundus: firm Incision: no significant drainage DVT Evaluation: No evidence of DVT seen on physical exam.   Recent Labs  03/03/16 0826 03/04/16 0513  HGB 10.9* 9.1*  HCT 32.6* 27.2*   CBC    Component Value Date/Time   WBC 19.6* 03/04/2016 0513   WBC 11.8* 12/15/2015 1106   RBC 3.28* 03/04/2016 0513   RBC 3.58* 12/15/2015 1106   HGB 9.1* 03/04/2016 0513   HCT 27.2* 03/04/2016 0513   HCT 30.7* 12/15/2015 1106   PLT 185 03/04/2016 0513   PLT 236 12/15/2015 1106   MCV 82.9 03/04/2016 0513   MCV 86 12/15/2015 1106   MCH 27.7 03/04/2016 0513   MCH 29.1 12/15/2015 1106   MCHC 33.5 03/04/2016 0513   MCHC 33.9 12/15/2015 1106   RDW 14.8 03/04/2016 0513   RDW 12.3 12/15/2015 1106   LYMPHSABS 2.0 10/10/2015 1552   MONOABS 0.6 10/10/2015 1552   EOSABS 0.2 10/10/2015 1552   BASOSABS 0.0 10/10/2015 1552    CMP     Component Value Date/Time   NA 135 03/04/2016 0513   K 3.8 03/04/2016 0513   CL 104 03/04/2016 0513   CO2 26 03/04/2016 0513   GLUCOSE 94 03/04/2016 0513   BUN 6 03/04/2016 0513   CREATININE 0.78 03/04/2016 0513   CALCIUM 7.2* 03/04/2016 0513   PROT 5.8* 03/04/2016 0513   ALBUMIN 2.5* 03/04/2016 0513   AST 24 03/04/2016 0513   ALT 24 03/04/2016 0513   ALKPHOS 139* 03/04/2016 0513   BILITOT 0.2* 03/04/2016 0513   GFRNONAA >60 03/04/2016 0513   GFRAA >60 03/04/2016 0513       Assessment/Plan: Status post Cesarean section. Doing well postoperatively.  D/C Magnesium.  ARNOLD,JAMES 03/04/2016, 6:57 AM

## 2016-03-04 NOTE — Progress Notes (Signed)
Was assessing drsg on incision   Could easily see honeycomb under saturated   So i dc/d pressure drsg and applied new honeycomb with sterile technique

## 2016-03-04 NOTE — Lactation Note (Signed)
This note was copied from a baby's chart. Lactation Consultation Note  Patient Name: Deanna Senaida OresSharlaye Wieman ZOXWR'UToday's Date: 03/04/2016 Reason for consult: Follow-up assessment;Infant < 6lbs   Follow up with mom of 28 hour old infant in Antenatal. Infant with 7 Bf for 14-40 minutes, 5 bottle feeds of formula of 7-30 cc, 2 voids and 3 stools in 24 hours prior to this assessment. Mom reports that infant has stooled 2-3 x since being in her room and they were on her feeding lot, although they were not documented on infant feeding flow sheet. Stools now documented on flow sheet. Infant weight 5 lb 14.7 oz with weight loss of 2 % since birth. LATCH scores 5-8 by bedside RN's. Infant was MSF. Mom MgSO4 has been d/c.   Mom was BF infant when I went into the room. Infant was latched and feeding off and on needing stimulation to maintain suckling for longer bursts. Reviewed nl NB feeding behavior and need for stimulation while feeding to maximize milk transfer. Mom reports she does not feel any fuller, colostrum easy to express with hand expression. Enc mom to massage breasts with feeding and noted that infant swallows increased. Mom massaging with remainder of feeding. Discussed normalcy of cluster feeding. Mom is supplementing with formula. Enc her to pump and use her BM after infant feeding and that infant can increase to 10-20 cc supplement (EBM or formula) post BF. She has a manual pump at bedside.   Mom had infant positioned well and had her STS with feeding. Enc mom to keep up the good work and to call for assistance as needed. Follow up tomorrow and prn. Mom without questions/concerns at this time.    Maternal Data Has patient been taught Hand Expression?: Yes Does the patient have breastfeeding experience prior to this delivery?: No  Feeding Feeding Type: Breast Fed Length of feed: 20 min  LATCH Score/Interventions Latch: Repeated attempts needed to sustain latch, nipple held in mouth throughout  feeding, stimulation needed to elicit sucking reflex. Intervention(s): Skin to skin;Teach feeding cues;Waking techniques  Audible Swallowing: Spontaneous and intermittent Intervention(s): Skin to skin;Hand expression Intervention(s): Alternate breast massage;Hand expression  Type of Nipple: Everted at rest and after stimulation  Comfort (Breast/Nipple): Filling, red/small blisters or bruises, mild/mod discomfort  Problem noted: Mild/Moderate discomfort Interventions (Mild/moderate discomfort):  (Deepen latch, BF basics and EBM to nipples post BF)  Hold (Positioning): No assistance needed to correctly position infant at breast. Intervention(s): Breastfeeding basics reviewed;Support Pillows;Position options;Skin to skin  LATCH Score: 8  Lactation Tools Discussed/Used     Consult Status Consult Status: Follow-up Date: 03/05/16 Follow-up type: In-patient    Silas FloodSharon S Lorelai Huyser 03/04/2016, 11:38 AM

## 2016-03-04 NOTE — Progress Notes (Signed)
Lisa leftwich CNM notified of recent high b/ps    Cont to monitor and notify if 160/110

## 2016-03-05 MED ORDER — LABETALOL HCL 200 MG PO TABS
200.0000 mg | ORAL_TABLET | Freq: Three times a day (TID) | ORAL | Status: DC
  Administered 2016-03-05 – 2016-03-06 (×4): 200 mg via ORAL
  Filled 2016-03-05 (×4): qty 1

## 2016-03-05 NOTE — Progress Notes (Signed)
Subjective: Postpartum Day #2: Cesarean Delivery Patient reports incisional pain, tolerating PO and no problems voiding.  Denies any HA, N/V/D.    Objective: Vital signs in last 24 hours: Temp:  [97.7 F (36.5 C)-98.3 F (36.8 C)] 98 F (36.7 C) (07/18 0526) Pulse Rate:  [66-88] 66 (07/18 0526) Resp:  [16-20] 20 (07/18 0526) BP: (130-153)/(77-95) 153/85 mmHg (07/18 0526)  Physical Exam:  General: alert, cooperative and no distress Lochia: appropriate Uterine Fundus: firm Incision: no significant drainage, no significant erythema DVT Evaluation: No evidence of DVT seen on physical exam. No cords or calf tenderness. No significant calf/ankle edema.   Recent Labs  03/03/16 0826 03/04/16 0513  HGB 10.9* 9.1*  HCT 32.6* 27.2*    Assessment/Plan: Status post Cesarean section. Doing well postoperatively.   Preeclampsia: off of magnesium drip.  Labetalol 200mg  TID started.   Continue current care.  Deanna Duffy 03/05/2016, 8:34 AM

## 2016-03-05 NOTE — Lactation Note (Signed)
This note was copied from a baby's chart. Lactation Consultation Note  Patient Name: Girl Senaida OresSharlaye Bassette ZOXWR'UToday's Date: 03/05/2016 Reason for consult: Follow-up assessment  Visited with Mom, baby 4856 hrs old.  Mom asking for assist as she isn't sure whether baby is latched on more than just nipple.  Mom assisted to position baby in football hold, and manual expression done with instruction.  Transitional milk expressed easily.  Breasts feel like milk volume is increasing.  Baby latched on easily.  Taught Mom about importance of good breast support, and how to sandwich to facilitate a deeper areolar latch.  Swallows seen and heard.  Mom denies any discomfort or pinching.  Encouraged exclusive breast feeding now that milk volume is increasing to avoid engorgement.  (Baby has had one bottle formula)  Encouraged skin to skin, with frequent cue based feedings.  Encouraged Mom to ask for help as needed to assure a deep latch.  LC to follow up in AM.  LATCH Score/Interventions Latch: Grasps breast easily, tongue down, lips flanged, rhythmical sucking. Intervention(s): Skin to skin Intervention(s): Adjust position;Assist with latch;Breast massage;Breast compression  Audible Swallowing: Spontaneous and intermittent Intervention(s): Hand expression;Skin to skin Intervention(s): Skin to skin;Hand expression;Alternate breast massage  Type of Nipple: Everted at rest and after stimulation  Comfort (Breast/Nipple): Soft / non-tender  Problem noted: Filling Interventions (Filling): Firm support;Frequent nursing Interventions (Mild/moderate discomfort): Hand massage;Hand expression  Hold (Positioning): Assistance needed to correctly position infant at breast and maintain latch. Intervention(s): Breastfeeding basics reviewed;Support Pillows;Position options;Skin to skin  LATCH Score: 9  Lactation Tools Discussed/Used     Consult Status Consult Status: Follow-up Date: 03/06/16 Follow-up type:  In-patient    Judee ClaraSmith, Kaymarie Wynn E 03/05/2016, 3:07 PM

## 2016-03-06 ENCOUNTER — Encounter: Payer: Medicaid Other | Admitting: Certified Nurse Midwife

## 2016-03-06 MED ORDER — LABETALOL HCL 200 MG PO TABS
300.0000 mg | ORAL_TABLET | Freq: Three times a day (TID) | ORAL | Status: DC
  Administered 2016-03-06 – 2016-03-07 (×3): 300 mg via ORAL
  Filled 2016-03-06 (×3): qty 1

## 2016-03-06 MED ORDER — NIFEDIPINE ER OSMOTIC RELEASE 30 MG PO TB24
60.0000 mg | ORAL_TABLET | Freq: Every day | ORAL | Status: DC
  Administered 2016-03-06 – 2016-03-07 (×2): 60 mg via ORAL
  Filled 2016-03-06 (×2): qty 2

## 2016-03-06 NOTE — Progress Notes (Addendum)
Subjective: Postpartum Day #3: Cesarean Delivery Patient reports incisional pain, tolerating PO, + flatus and no problems voiding.  Is pumping.  Denies HA, N/V/D.  Objective: Vital signs in last 24 hours: Temp:  [98.5 F (36.9 C)-98.8 F (37.1 C)] 98.8 F (37.1 C) (07/19 0605) Pulse Rate:  [67-88] 67 (07/19 0605) Resp:  [18] 18 (07/18 1827) BP: (124-165)/(82-96) 165/96 mmHg (07/19 [id number])  Physical Exam:  General: alert, cooperative and no distress Lochia: appropriate Uterine Fundus: firm Incision: no significant drainage, no significant erythema DVT Evaluation: No evidence of DVT seen on physical exam. No cords or calf tenderness. No significant calf/ankle edema.   Recent Labs  03/03/16 0826 03/04/16 0513  HGB 10.9* 9.1*  HCT 32.6* 27.2*    Assessment/Plan: Status post Cesarean section. Postoperative course complicated by preeclampsia: blood pressures continued to be elevated.     Preeclampsia; Labetalol increased to 300mg  TID, Nifedipine 60 mg started Continue current care.  Roe Coombsachelle A Dacoda Finlay, CNM 03/06/2016, 7:52 AM

## 2016-03-06 NOTE — Lactation Note (Signed)
This note was copied from a baby's chart. Lactation Consultation Note Mom engorged. ICE given, DEBP set up and cleaning instructions demonstrated. Pumped 30ml, hand expressed 20ml. Total of 50ml colostrum. Laid mom back in bed to assist in breast draining. Baby BF and softened Rt. Breast. Breast knots massaged and helping resolved. Instructed mom not to give any more formula, just colostrum. Encouraged to occasionally massage breast during BF. Wearing supportive bra. Discussed engorgement prevention and management.  Patient Name: Deanna Duffy'JToday's Date: 03/06/2016 Reason for consult: Follow-up assessment;Breast/nipple pain;Infant < 6lbs   Maternal Data    Feeding Feeding Type: Breast Milk Nipple Type: Slow - flow Length of feed: 15 min  LATCH Score/Interventions Latch: Grasps breast easily, tongue down, lips flanged, rhythmical sucking. Intervention(s): Breast massage;Breast compression;Adjust position  Audible Swallowing: Spontaneous and intermittent Intervention(s): Hand expression;Alternate breast massage;Skin to skin  Type of Nipple: Everted at rest and after stimulation  Comfort (Breast/Nipple): Engorged, cracked, bleeding, large blisters, severe discomfort Problem noted: Engorgment Intervention(s): Hand expression;Ice  Interventions (Filling): Massage;Firm support;Frequent nursing;Double electric pump Interventions (Mild/moderate discomfort): Post-pump;Hand expression;Hand massage  Hold (Positioning): No assistance needed to correctly position infant at breast. Intervention(s): Skin to skin;Position options;Support Pillows;Breastfeeding basics reviewed  LATCH Score: 8  Lactation Tools Discussed/Used Tools: Pump Breast pump type: Double-Electric Breast Pump Pump Review: Milk Storage Initiated by:: Peri JeffersonL. Cynthia Cogle RN IBCLC Date initiated:: 03/06/16   Consult Status Consult Status: Follow-up Date: 03/07/16 Follow-up type: In-patient    Charyl DancerCARVER, Brogan England  G 03/06/2016, 6:58 AM

## 2016-03-06 NOTE — Lactation Note (Signed)
This note was copied from a baby's chart. Lactation Consultation Note  Patient Name: Deanna Senaida OresSharlaye Zetino ZOXWR'UToday's Date: 03/06/2016 Reason for consult: Follow-up assessment  Follow up visit made.  Mom states engorgement is improving.  She is using ice packs and pumping for comfort.  She pumped 50 mls of transitional milk this AM.  Breasts are very full this AM but not hard.  Instructed to use packs for 30 minutes after feedings and take ibuprofen as prescribed.  Observed mom latch baby well using football hold.  Breast massage encouraged during feeding.  Lactation outpatient services and support reviewed and encouraged prn.  Maternal Data    Feeding Feeding Type: Breast Fed Nipple Type: Slow - flow Length of feed: 15 min  LATCH Score/Interventions Latch: Grasps breast easily, tongue down, lips flanged, rhythmical sucking. Intervention(s): Breast massage;Breast compression  Audible Swallowing: Spontaneous and intermittent Intervention(s): Alternate breast massage  Type of Nipple: Everted at rest and after stimulation  Comfort (Breast/Nipple): Filling, red/small blisters or bruises, mild/mod discomfort Problem noted: Engorgment Intervention(s): Hand expression;Ice  Interventions (Filling): Massage;Firm support;Frequent nursing;Double electric pump Interventions (Mild/moderate discomfort): Post-pump;Hand expression;Hand massage  Hold (Positioning): No assistance needed to correctly position infant at breast. Intervention(s): Skin to skin;Position options;Support Pillows;Breastfeeding basics reviewed  LATCH Score: 9  Lactation Tools Discussed/Used Tools: Pump Breast pump type: Double-Electric Breast Pump Pump Review: Milk Storage Initiated by:: Peri JeffersonL. Carver RN IBCLC Date initiated:: 03/06/16   Consult Status Consult Status: Follow-up Date: 03/07/16 Follow-up type: In-patient    Huston FoleyMOULDEN, Joelle Flessner S 03/06/2016, 9:44 AM

## 2016-03-07 MED ORDER — MAGNESIUM HYDROXIDE 400 MG/5ML PO SUSP: 30.0000 mL | ORAL | Status: DC | PRN

## 2016-03-07 MED ORDER — COCONUT OIL OIL: 1.0000 "application " | TOPICAL_OIL | Status: DC | PRN

## 2016-03-07 MED ORDER — OXYCODONE-ACETAMINOPHEN 5-325 MG PO TABS: 1.0000 | ORAL_TABLET | ORAL | Status: DC | PRN

## 2016-03-07 MED ORDER — IBUPROFEN 800 MG PO TABS: 800.0000 mg | ORAL_TABLET | Freq: Three times a day (TID) | ORAL | Status: DC

## 2016-03-07 MED ORDER — NIFEDIPINE ER 60 MG PO TB24: 60.0000 mg | ORAL_TABLET | Freq: Every day | ORAL | Status: DC

## 2016-03-07 MED ORDER — SENNOSIDES-DOCUSATE SODIUM 8.6-50 MG PO TABS: 2.0000 | ORAL_TABLET | Freq: Two times a day (BID) | ORAL | Status: DC

## 2016-03-07 MED ORDER — LABETALOL HCL 300 MG PO TABS: 300.0000 mg | ORAL_TABLET | Freq: Three times a day (TID) | ORAL | Status: DC

## 2016-03-07 NOTE — Lactation Note (Addendum)
This note was copied from a baby's chart. Lactation Consultation Note  Patient Name: Girl Keena Dinse TVIFX'G Date: 03/07/2016 Reason for consult: Follow-up assessment;Infant < 17lbs  10 22 days old. Mom reports that baby is nursing much better now and she is able to soften her breast while nursing. Mom states also that her engorgement is subsiding. Enc mom to continue to put baby to breast with cues, and at least every 3 hours--waking her if necessary. Enc mom to supplement baby with EBM after nursing and then post-pump followed by hand expression. Enc mom to ice breast prior to pumping as long as she is still feeling engorged.   Mom given [place] paperwork and enc to call when she has the money for the loaner. Parents given this LC's extension to call to bring them a pump as needed. Referred parents to Baby and Me booklet for number of diapers to expect by day of life and EBM storage guidelines. Parents aware of OP/BFSG and [place] phone line assistance after D/C. Demonstrated to mom how to use piston in her pumping kit for manual pumping as needed.  Maternal Data    Feeding Feeding Type: Breast Fed Length of feed: 10 min  LATCH Score/Interventions Latch: Grasps breast easily, tongue down, lips flanged, rhythmical sucking.  Audible Swallowing: Spontaneous and intermittent Intervention(s): Skin to skin;Hand expression  Type of Nipple: Everted at rest and after stimulation  Comfort (Breast/Nipple): Filling, red/small blisters or bruises, mild/mod discomfort  Problem noted: Filling Interventions (Filling): Double electric pump  Hold (Positioning): No assistance needed to correctly position infant at breast.  LATCH Score: 9  Lactation Tools Discussed/Used     Consult Status Consult Status: PRN    Inocente Salles 03/07/2016, 9:21 AM

## 2016-03-07 NOTE — Discharge Summary (Signed)
Obstetric Discharge Summary Reason for Admission: induction of labor and for severe Preeclampsia Prenatal Procedures: NST and ultrasound Intrapartum Procedures: cesarean: low cervical, transverse Postpartum Procedures: none Complications-Operative and Postpartum: none HEMOGLOBIN  Date Value Ref Range Status  03/04/2016 9.1* 12.0 - 15.0 g/dL Final   HCT  Date Value Ref Range Status  03/04/2016 27.2* 36.0 - 46.0 % Final   HEMATOCRIT  Date Value Ref Range Status  12/15/2015 30.7* 34.0 - 46.6 % Final    Physical Exam:  General: alert, cooperative and no distress Lochia: appropriate Uterine Fundus: firm Incision: no significant drainage, no dehiscence, no significant erythema DVT Evaluation: No evidence of DVT seen on physical exam. No cords or calf tenderness. No significant calf/ankle edema.  Discharge Diagnoses: Term Pregnancy-delivered and Preelampsia  Discharge Information: Date: 03/07/2016 Activity: pelvic rest Diet: routine Medications: PNV, Ibuprofen, Colace, Iron, Percocet and labetalol, Nifedipine Condition: stable Instructions: refer to practice specific booklet Discharge to: home Follow-up Information    [facility]. Schedule an appointment as soon as possible for a visit in 1 week.   Why:  B/P, incision check   Contact information:   [address] [place] [id number] [phone]      Newborn Data: Live born female  Birth Weight: 6 lb 0.7 oz (2740 g) APGAR: 9, 9  Home with mother.  Roe Coombsachelle A Deanna Duffy, CNM 03/07/2016, 8:27 AM

## 2016-07-05 ENCOUNTER — Encounter (HOSPITAL_COMMUNITY): Payer: Self-pay

## 2016-07-05 ENCOUNTER — Ambulatory Visit (HOSPITAL_COMMUNITY)
Admission: EM | Admit: 2016-07-05 | Discharge: 2016-07-05 | Disposition: A | Discharge status: Home / Self Care | Payer: Medicaid Other | Attending: Family Medicine | Admitting: Family Medicine

## 2016-07-05 DIAGNOSIS — J209 Acute bronchitis, unspecified: Secondary | ICD-10-CM | POA: Diagnosis not present

## 2016-07-05 DIAGNOSIS — J069 Acute upper respiratory infection, unspecified: Secondary | ICD-10-CM

## 2016-07-05 MED ORDER — AMOXICILLIN-POT CLAVULANATE 875-125 MG PO TABS: 1.0000 | ORAL_TABLET | Freq: Two times a day (BID) | ORAL | 0 refills | Status: DC

## 2016-07-05 MED ORDER — HYDROCODONE-HOMATROPINE 5-1.5 MG/5ML PO SYRP: 5.0000 mL | ORAL_SOLUTION | Freq: Four times a day (QID) | ORAL | 0 refills | Status: DC | PRN

## 2016-07-05 NOTE — ED Triage Notes (Signed)
Pt has been sick for the past 4 days, cough, bilateral ear pain, chills, no temp. Taken at home, sinus drainage, chills, body aches and runny nose. Has been taking nyquil and cold flu medication, without relief and still getting worse.

## 2016-07-05 NOTE — ED Provider Notes (Addendum)
[facility]    CSN: [id number] Arrival date & time: 07/05/16  1138     History   Chief Complaint Chief Complaint  Patient presents with  . Cough    HPI Deanna Duffy is a 20 y.o. female.   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 She's had some chills and sweats but no known fever.  Patient does not have asthma as far she knows. She does not smoke. She has had severe upper respiratory infections in the past  Patient works at [employer]. She goes to [employer].      Past Medical History:  Diagnosis Date  . Bronchitis   . Lactose intolerance     Patient Active Problem List   Diagnosis Date Noted  . S/P cesarean section 03/03/2016  . Active labor at term 03/02/2016  . Supervision of pregnancy with history of ectopic pregnancy, second trimester 10/10/2015  . 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 Pelvic pain in female   . Ruptured ectopic pregnancy   . ECZEMA, ATOPIC DERMATITIS 10/16/2006    Past Surgical History:  Procedure Laterality Date  . CESAREAN SECTION N/A 03/03/2016   Procedure: CESAREAN SECTION;  Surgeon: Tereso Newcomer, MD;  [facility];  Service: Obstetrics;  Laterality: N/A;  . LAPAROSCOPY N/A 03/07/2015   Procedure: LAPAROSCOPY DIAGNOSTIC;  Surgeon: Willodean Rosenthal, MD;  Location: [facility];  Service: Gynecology;  Laterality: N/A;  . UNILATERAL SALPINGECTOMY Right 03/07/2015   Procedure: UNILATERAL SALPINGECTOMYwith ectopic pregnancy;  Surgeon: Willodean Rosenthal, MD;  Location: [facility];  Service: Gynecology;  Laterality: Right;    OB History    Gravida Para Term Preterm AB Living   2 1 1   1 1    SAB TAB Ectopic Multiple Live Births       1 0 1       Home Medications    Prior to Admission medications   Medication Sig Start Date End Date Taking? Authorizing Provider  coconut oil OIL Apply 1 application  topically as needed. To nipples. 03/07/16  Yes Rachelle A Denney, CNM  ibuprofen (ADVIL,MOTRIN) 800 MG tablet Take 1 tablet (800 mg total) by mouth 3 (three) times daily. 03/07/16  Yes Rachelle A Denney, CNM  iron polysaccharides (NIFEREX) 150 MG capsule Take 1 capsule (150 mg total) by mouth daily. 12/28/15  Yes Rachelle A Denney, CNM  labetalol (NORMODYNE) 300 MG tablet Take 1 tablet (300 mg total) by mouth 3 (three) times daily. 03/07/16  Yes Rachelle A Denney, CNM  mometasone (NASONEX) 50 MCG/ACT nasal spray Place 2 sprays into both nostrils daily.   Yes Historical Provider, MD  NIFEdipine (PROCARDIA-XL/ADALAT CC) 60 MG 24 hr tablet Take 1 tablet (60 mg total) by mouth daily. 03/07/16  Yes Rachelle A Denney, CNM  oxyCODONE-acetaminophen (PERCOCET/ROXICET) 5-325 MG tablet Take 1-2 tablets by mouth every 4 (four) hours as needed for moderate pain (pain scale > 7). 03/07/16  Yes Rachelle A Denney, CNM  Prenatal Vit-Fe Phos-FA-Omega (VITAFOL GUMMIES) 3.33-0.333-34.8 MG CHEW Chew 3 each by mouth daily.   Yes Historical Provider, MD  amoxicillin-clavulanate (AUGMENTIN) 875-125 MG tablet Take 1 tablet by mouth every 12 (twelve) hours. 07/05/16   Elvina Sidle, MD  HYDROcodone-homatropine [facility]) 5-1.5 MG/5ML syrup Take 5 mLs by mouth every 6 (six) hours as needed for cough. 07/05/16   Elvina Sidle, MD  magnesium hydroxide (MILK OF MAGNESIA) 400 MG/5ML  suspension Take 30 mLs by mouth every three (3) days as needed for mild constipation (if no BM). 03/07/16   Rachelle A Denney, CNM  senna-docusate (SENOKOT-S) 8.6-50 MG tablet Take 2 tablets by mouth 2 (two) times daily. 03/07/16   Roe Coombsachelle A Denney, CNM    Family History Family History  Problem Relation Age of Onset  . Cancer Neg Hx   . Diabetes Neg Hx   . Hypertension Neg Hx     Social History Social History  Substance Use Topics  . Smoking status: Never Smoker  . Smokeless tobacco: Never Used  . Alcohol use No     Allergies   Tuna [fish  allergy] and Chocolate   Review of Systems Review of Systems  Constitutional: Positive for chills, diaphoresis and fatigue.  HENT: Positive for congestion, ear pain and sinus pressure.   Eyes: Negative.   Respiratory: Positive for cough, shortness of breath and wheezing.   Cardiovascular: Negative.   Gastrointestinal: Negative.   Genitourinary: Negative.   Musculoskeletal: Negative.   Neurological: Negative.   Psychiatric/Behavioral: Negative.      Physical Exam Triage Vital Signs ED Triage Vitals  Enc Vitals Group     BP 07/05/16 1148 127/79     Pulse Rate 07/05/16 1148 99     Resp 07/05/16 1148 16     Temp 07/05/16 1148 97.8 F (36.6 C)     Temp Source 07/05/16 1148 Oral     SpO2 07/05/16 1148 100 %     Weight --      Height 07/05/16 1150 5\' 1"  (1.549 m)     Head Circumference --      Peak Flow --      Pain Score 07/05/16 1151 6     Pain Loc --      Pain Edu? --      Excl. in GC? --    No data found.   Updated Vital Signs BP 127/79 (BP Location: Right Arm)   Pulse 99   Temp 97.8 F (36.6 C) (Oral)   Resp 16   Ht 5\' 1"  (1.549 m)   LMP 06/07/2016 (Within Days)   SpO2 100%      Physical Exam  Constitutional: She is oriented to person, place, and time. She appears well-developed and well-nourished.  HENT:  Head: Normocephalic.  Right Ear: External ear normal.  Left Ear: External ear normal.  Mouth/Throat: Oropharynx is clear and moist.  Both TMs are dull and slightly bulging  Eyes: Conjunctivae and EOM are normal. Pupils are equal, round, and reactive to light.  Neck: Normal range of motion. Neck supple. No thyromegaly present.  Cardiovascular: Normal rate, regular rhythm and normal heart sounds.   Pulmonary/Chest: Effort normal. She has wheezes. She has rales.  Very congested cough, bilateral wheezes, bilateral expiratory rhonchi and rales  Musculoskeletal: Normal range of motion.  Lymphadenopathy:    She has no cervical adenopathy.  Neurological:  She is alert and oriented to person, place, and time.  Skin: Skin is warm and dry.  Psychiatric: She has a normal mood and affect. Her behavior is normal. Judgment and thought content normal.  Nursing note and vitals reviewed.    UC Treatments / Results  Labs (all labs ordered are listed, but only abnormal results are displayed) Labs Reviewed - No data to display  EKG  EKG Interpretation None       Radiology No results found.  Procedures Procedures (including critical care time)  Medications Ordered in UC Medications -  No data to display   Initial Impression / Assessment and Plan / UC Course  I have reviewed the triage vital signs and the nursing notes.  Pertinent labs & imaging results that were available during my care of the patient were reviewed by me and considered in my medical decision making (see chart for details).  Clinical Course     Final Clinical Impressions(s) / UC Diagnoses   Final diagnoses:  Acute bronchitis, unspecified organism  Acute upper respiratory infection    New Prescriptions New Prescriptions   AMOXICILLIN-CLAVULANATE (AUGMENTIN) 875-125 MG TABLET    Take 1 tablet by mouth every 12 (twelve) hours.   HYDROCODONE-HOMATROPINE (HYCODAN) 5-1.5 MG/5ML SYRUP    Take 5 mLs by mouth every 6 (six) hours as needed for cough.     Elvina SidleKurt Burlene Montecalvo, MD 07/05/16 1220    Elvina SidleKurt Shirl [place], MD 07/05/16 [phone]

## 2016-08-28 ENCOUNTER — Ambulatory Visit (HOSPITAL_COMMUNITY)
Admission: EM | Admit: 2016-08-28 | Discharge: 2016-08-28 | Disposition: A | Discharge status: Home / Self Care | Payer: Self-pay | Attending: Internal Medicine | Admitting: Internal Medicine

## 2016-08-28 ENCOUNTER — Encounter (HOSPITAL_COMMUNITY): Payer: Self-pay | Admitting: Emergency Medicine

## 2016-08-28 DIAGNOSIS — J40 Bronchitis, not specified as acute or chronic: Secondary | ICD-10-CM

## 2016-08-28 DIAGNOSIS — R059 Cough, unspecified: Secondary | ICD-10-CM

## 2016-08-28 DIAGNOSIS — R05 Cough: Secondary | ICD-10-CM

## 2016-08-28 MED ORDER — BENZONATATE 100 MG PO CAPS: 200.0000 mg | ORAL_CAPSULE | Freq: Three times a day (TID) | ORAL | 0 refills | Status: DC | PRN

## 2016-08-28 MED ORDER — AZITHROMYCIN 250 MG PO TABS: 250.0000 mg | ORAL_TABLET | Freq: Every day | ORAL | 0 refills | Status: DC

## 2016-08-28 NOTE — ED Triage Notes (Signed)
[facility] a [facility] of shortness of breath and a cough that started yesterday.

## 2016-08-28 NOTE — ED Provider Notes (Signed)
CSN: [id number]     Arrival date & time 08/28/16  1239 History   First MD Initiated Contact with Patient 08/28/16 1433     Chief Complaint  Patient presents with  . Cough   (Consider location/radiation/quality/duration/timing/severity/associated sxs/prior Treatment) Patient c/o cough and uri sx's for 2 days   The history is provided by the patient.  Cough  Cough characteristics:  Productive Sputum characteristics:  Yellow Severity:  Moderate Onset quality:  Sudden Duration:  2 days Timing:  Constant Progression:  Worsening Smoker: no   Relieved by:  Nothing Worsened by:  Nothing   Past Medical History:  Diagnosis Date  . Bronchitis   . Lactose intolerance    Past Surgical History:  Procedure Laterality Date  . CESAREAN SECTION N/A 03/03/2016   Procedure: CESAREAN SECTION;  Surgeon: Tereso NewcomerUgonna A Anyanwu, MD;  [facility]: [facility];  Service: Obstetrics;  Laterality: N/A;  . LAPAROSCOPY N/A 03/07/2015   Procedure: LAPAROSCOPY DIAGNOSTIC;  Surgeon: Willodean Rosenthalarolyn Harraway-Smith, MD;  Location: [facility];  Service: Gynecology;  Laterality: N/A;  . UNILATERAL SALPINGECTOMY Right 03/07/2015   Procedure: UNILATERAL SALPINGECTOMYwith ectopic pregnancy;  Surgeon: Willodean Rosenthalarolyn Harraway-Smith, MD;  Location: [facility];  Service: Gynecology;  Laterality: Right;   Family History  Problem Relation Age of Onset  . Cancer Neg Hx   . Diabetes Neg Hx   . Hypertension Neg Hx    Social History  Substance Use Topics  . Smoking status: Never Smoker  . Smokeless tobacco: Never Used  . Alcohol use No   OB History    Gravida Para Term Preterm AB Living   2 1 1   1 1    SAB TAB Ectopic Multiple Live Births       1 0 1     Review of Systems  Constitutional: Negative.   HENT: Negative.   Eyes: Negative.   Respiratory: Positive for cough.   Cardiovascular: Negative.   Gastrointestinal: Negative.   Endocrine: Negative.   Genitourinary: Negative.   Musculoskeletal: Negative.   Allergic/Immunologic:  Negative.   Neurological: Negative.   Hematological: Negative.   Psychiatric/Behavioral: Negative.     Allergies  Tuna [fish allergy] and Chocolate  Home Medications   Prior to Admission medications   Medication Sig Start Date End Date Taking? Authorizing Provider  azithromycin (ZITHROMAX) 250 MG tablet Take 1 tablet (250 mg total) by mouth daily. Take first 2 tablets together, then 1 every day until finished. 08/28/16   Deatra CanterWilliam J Oxford, FNP  benzonatate (TESSALON) 100 MG capsule Take 2 capsules (200 mg total) by mouth 3 (three) times daily as needed for cough. 08/28/16   Deatra CanterWilliam J Oxford, FNP   Meds Ordered and Administered this Visit  Medications - No data to display  BP 128/78 (BP Location: Left Arm)   Pulse 78   Temp 97.9 F (36.6 C) (Oral)   Resp 18   SpO2 100%  No data found.   Physical Exam  Constitutional: She appears well-developed and well-nourished.  HENT:  Head: Normocephalic and atraumatic.  Right Ear: External ear normal.  Left Ear: External ear normal.  Mouth/Throat: Oropharynx is clear and moist.  Eyes: Conjunctivae and EOM are normal. Pupils are equal, round, and reactive to light.  Neck: Normal range of motion. Neck supple.  Cardiovascular: Normal rate, regular rhythm and normal heart sounds.   Pulmonary/Chest: Effort normal and breath sounds normal.  Abdominal: Soft. Bowel sounds are normal.  Nursing note and vitals reviewed.   Urgent Care Course   Clinical  Course     Procedures (including critical care time)  Labs Review Labs Reviewed - No data to display  Imaging Review No results found.   Visual Acuity Review  Right Eye Distance:   Left Eye Distance:   Bilateral Distance:    Right Eye Near:   Left Eye Near:    Bilateral Near:         MDM   1. Bronchitis   2. Cough    zpak Tessalon perles Push po fluids, rest, tylenol and motrin otc prn as directed for fever, arthralgias, and myalgias.  Follow up prn if sx's continue or  persist.    Deatra Canter, FNP 08/28/16 1520

## 2016-10-04 IMAGING — US US OB TRANSVAGINAL
1 series · 15 of 28 positions shown · non-contrast
Comparison: None.

CLINICAL DATA: Two week history of abdominal cramping

EXAM:
OBSTETRIC <14 WK US AND TRANSVAGINAL OB US
TECHNIQUE: Both transabdominal and transvaginal ultrasound examinations were
performed for complete evaluation of the gestation as well as the
maternal uterus, adnexal regions, and pelvic cul-de-sac.
Transvaginal technique was performed to assess early pregnancy.

[Series 1: us ob transvaginal · 15 of 53 slices shown]
[im 1/53]
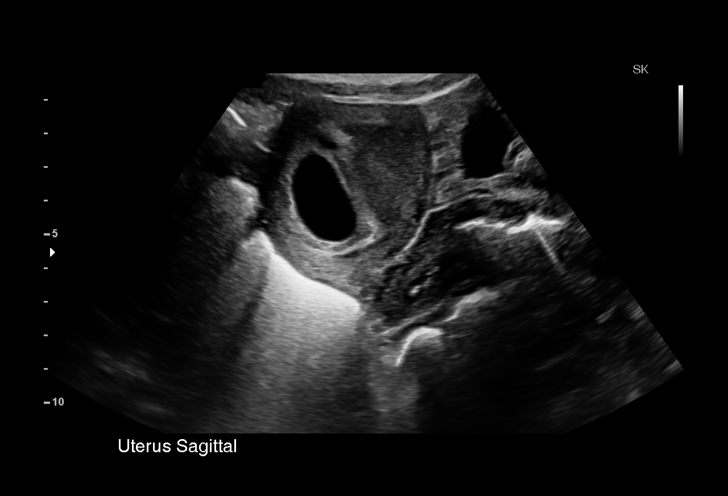
[im 4/53]
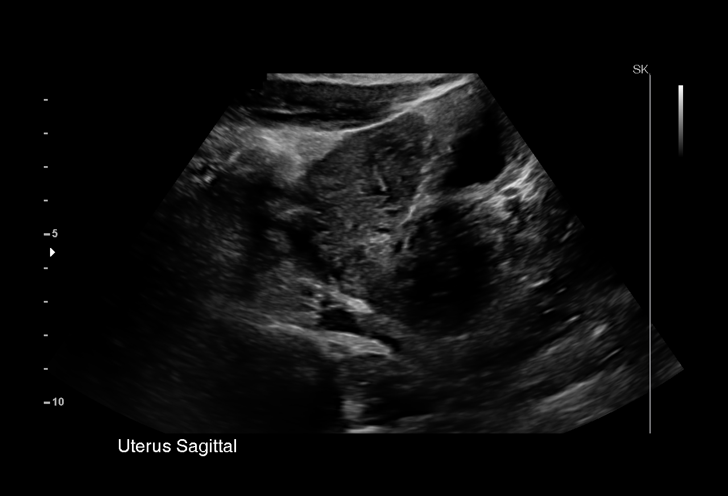
[im 8/53]
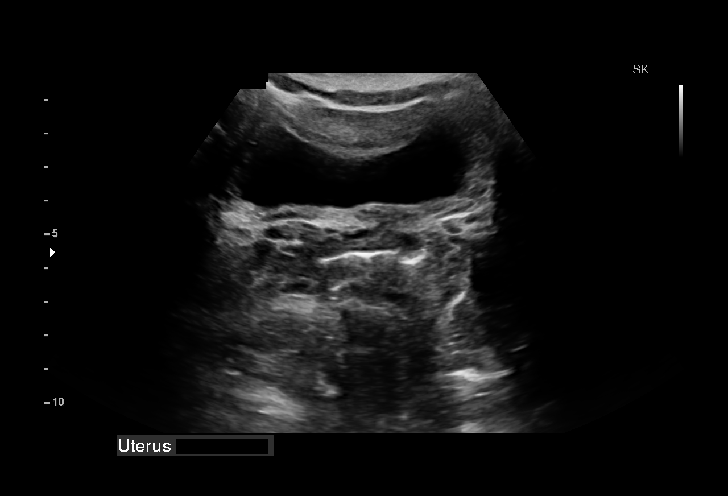
[im 12/53]
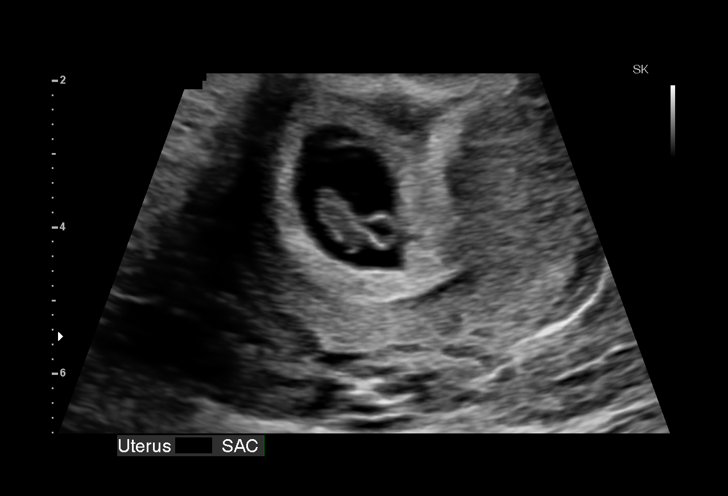
[im 16/53]
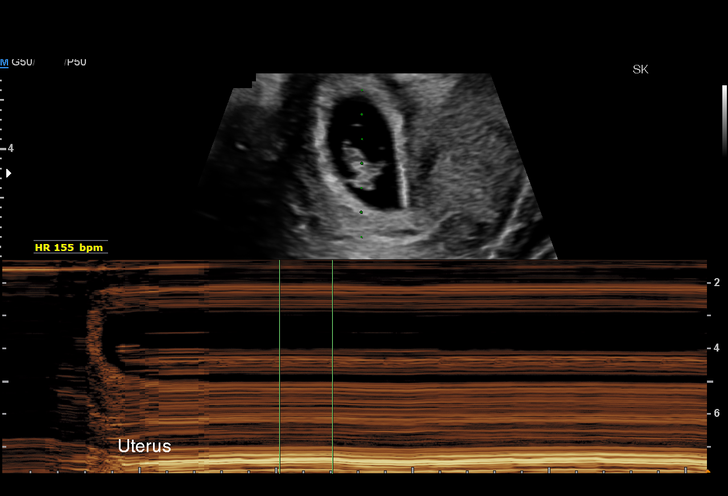
[im 20/53]
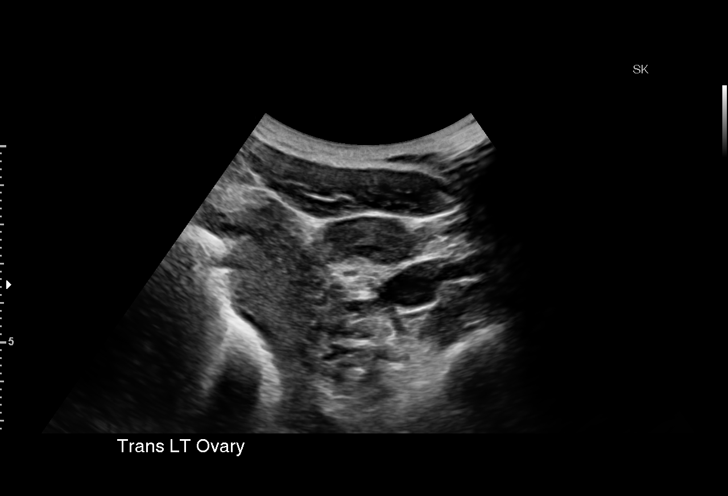
[im 24/53]
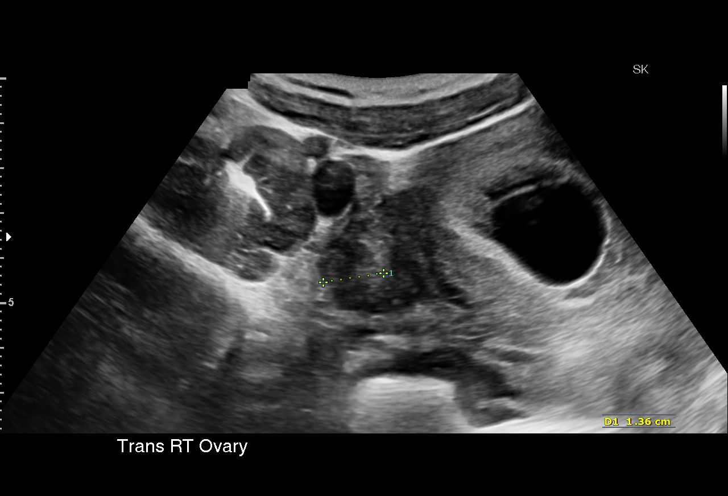
[im 27/53]
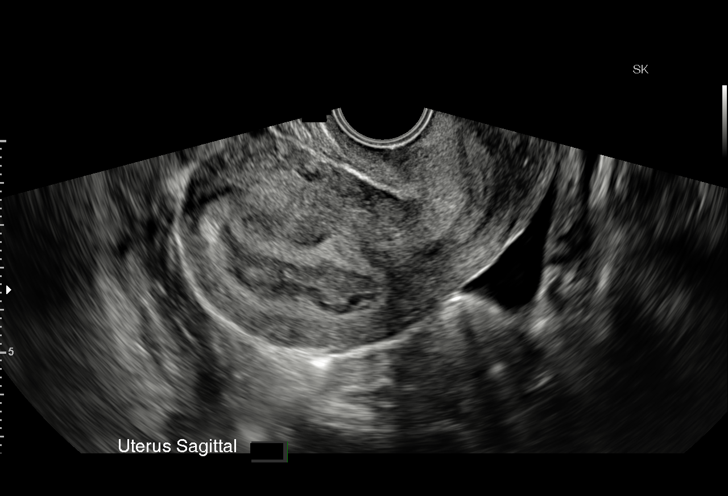
[im 29/53]
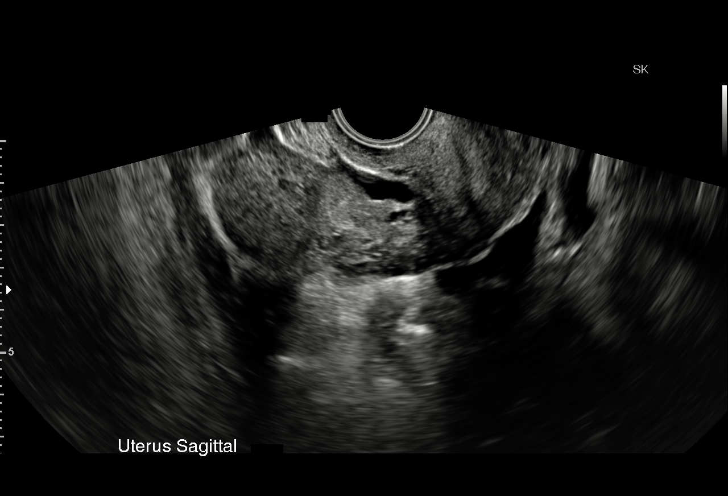
[im 33/53]
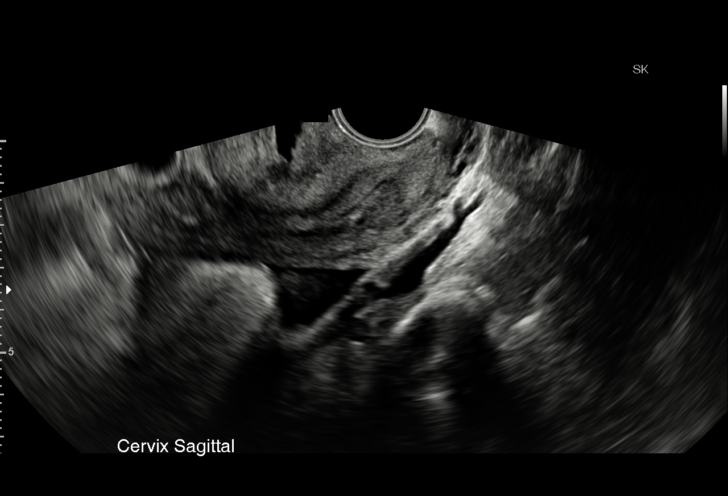
[im 37/53]
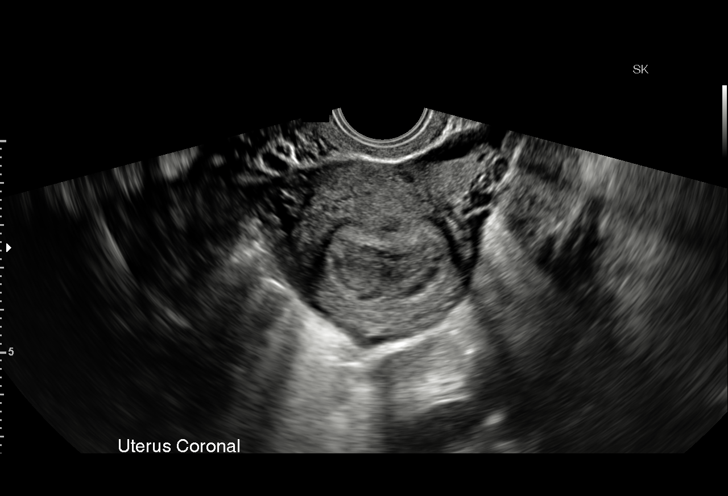
[im 41/53]
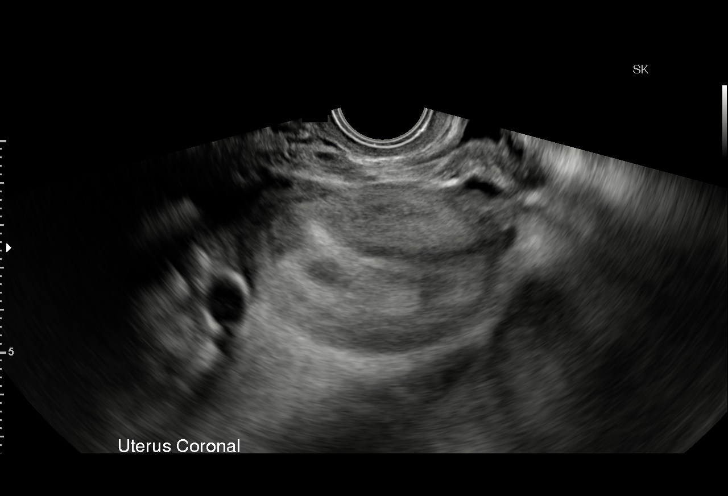
[im 45/53]
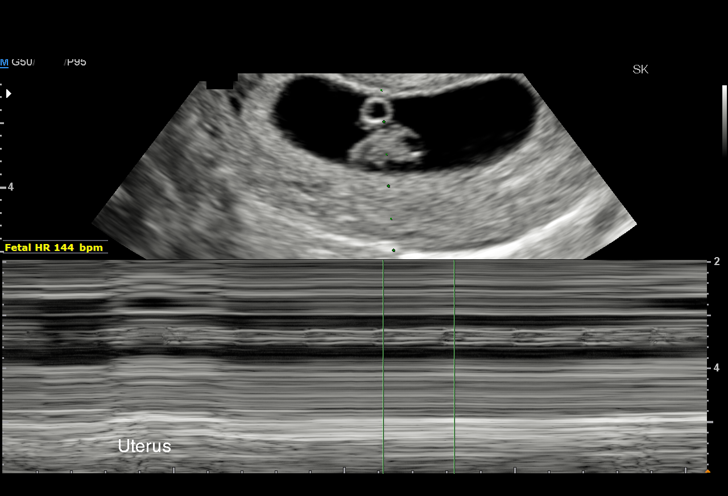
[im 49/53]
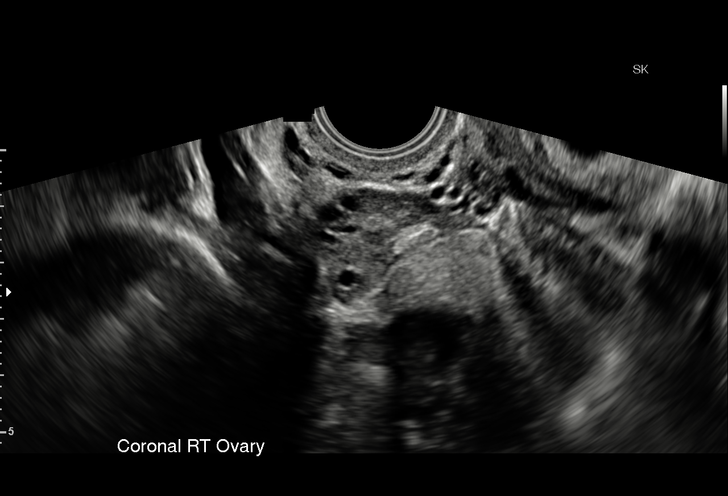
[im 53/53]
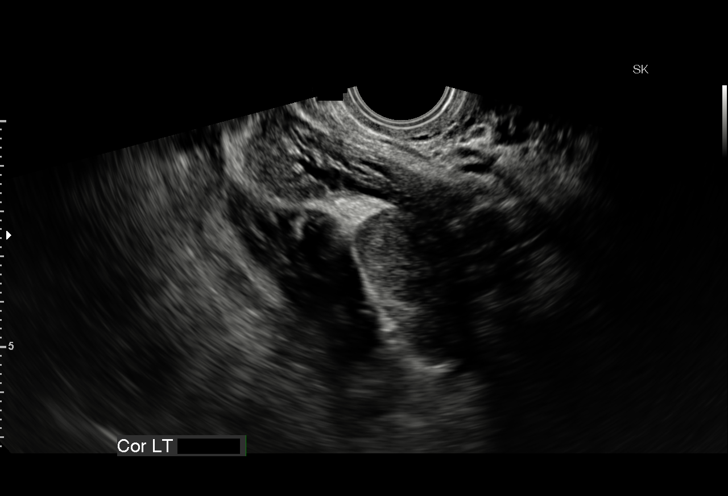

[15 of 28 positions shown; findings below may reference images not displayed]

FINDINGS: Intrauterine gestational sac: Visualized/normal in shape.

Yolk sac:  Visualized

Embryo:  Visualized

Cardiac Activity: Visualized

Heart Rate: 144  bpm

CRL:  12  mm   7 w   2 d                  US EDC: March 04, 2016

Maternal uterus/adnexae: There is no demonstrable subchorionic
hemorrhage. Cervical os is closed. There is no maternal extrauterine
pelvic or adnexal mass. There is a small amount of free maternal
pelvic fluid.
IMPRESSION: Single live intrauterine gestation with estimated gestational age of
7+ weeks. No subchorionic hemorrhage. Small amount of maternal free
fluid, possibly indicative of recent ovarian cyst rupture.

## 2017-04-14 NOTE — Telephone Encounter (Signed)
Opened in Error.

## 2017-06-23 ENCOUNTER — Ambulatory Visit (HOSPITAL_COMMUNITY)
Admission: EM | Admit: 2017-06-23 | Discharge: 2017-06-23 | Disposition: A | Discharge status: Home / Self Care | Payer: Medicaid Other | Attending: Emergency Medicine | Admitting: Emergency Medicine

## 2017-06-23 ENCOUNTER — Encounter (HOSPITAL_COMMUNITY): Payer: Self-pay | Admitting: Emergency Medicine

## 2017-06-23 DIAGNOSIS — T148XXA Other injury of unspecified body region, initial encounter: Secondary | ICD-10-CM

## 2017-06-23 MED ORDER — NAPROXEN 375 MG PO TABS: 375.0000 mg | ORAL_TABLET | Freq: Two times a day (BID) | ORAL | 0 refills | Status: DC

## 2017-06-23 MED ORDER — DICLOFENAC SODIUM 1 % TD GEL
1.0000 | Freq: Four times a day (QID) | TRANSDERMAL | 0 refills | Status: DC
Start: 2017-06-23 — End: 2018-01-12

## 2017-06-23 NOTE — ED Notes (Signed)
Discharged by provider

## 2017-06-23 NOTE — Discharge Instructions (Signed)
Apply the diclofenac gel to the area pain 4 times a day. Them place heat over the muscle that hurts. Perform the stretch that was demonstrated several times. Do this gently.

## 2017-06-23 NOTE — ED Triage Notes (Signed)
Pt reports left sided back pain near her shoulder blade that started last night.  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

## 2017-06-23 NOTE — ED Provider Notes (Signed)
[facility]    CSN: [id number] Arrival date & time: 06/23/17  1535     History   Chief Complaint Chief Complaint  Patient presents with  . Back Pain    HPI Deanna Duffy is a 21 y.o. female.   21 year old female complaining of left upper back pain since last PM. 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 She was lifting an object last night she experienced a sudden acute pain in the left parathoracic and trapezius muscle. Worse when moving her arm backwards. Has been taking ibuprofen with modest relief.      Past Medical History:  Diagnosis Date  . Bronchitis   . Lactose intolerance     Patient Active Problem List   Diagnosis Date Noted  . S/P cesarean section 03/03/2016  . Active labor at term 03/02/2016  . Supervision of pregnancy with history of ectopic pregnancy, second trimester 10/10/2015  . 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 Pelvic pain in female   . Ruptured ectopic pregnancy   . ECZEMA, ATOPIC DERMATITIS 10/16/2006    History reviewed. No pertinent surgical history.  OB History    Gravida Para Term Preterm AB Living   2 1 1   1 1    SAB TAB Ectopic Multiple Live Births       1 0 1       Home Medications    Prior to Admission medications   Medication Sig Start Date End Date Taking? Authorizing Provider  diclofenac sodium (VOLTAREN) 1 % GEL Apply 1 application 4 (four) times daily topically. 06/23/17   Hayden RasmussenMabe, Alannah Averhart, NP  naproxen (NAPROSYN) 375 MG tablet Take 1 tablet (375 mg total) 2 (two) times daily by mouth. Prn back pain 06/23/17   Hayden RasmussenMabe, Lamaria Hildebrandt, NP    Family History Family History  Problem Relation Age of Onset  . Cancer Neg Hx   . Diabetes Neg Hx   . Hypertension Neg Hx     Social History Social History   Tobacco Use  . Smoking status: Never Smoker  . Smokeless tobacco: Never Used  Substance Use Topics  . Alcohol use: No  . Drug use: No     Allergies   Tuna [fish allergy] and Chocolate   Review of  Systems Review of Systems  Constitutional: Negative for activity change, chills and fever.  HENT: Negative.   Respiratory: Negative.   Cardiovascular: Negative.   Musculoskeletal:       As per HPI  Skin: Negative for color change, pallor and rash.  Neurological: Negative.   All other systems reviewed and are negative.    Physical Exam Triage Vital Signs ED Triage Vitals  Enc Vitals Group     BP 06/23/17 1651 (!) 109/95     Pulse Rate 06/23/17 1651 83     Resp --      Temp 06/23/17 1651 98.2 F (36.8 C)     Temp Source 06/23/17 1651 Oral     SpO2 06/23/17 1651 100 %     Weight --      Height --      Head Circumference --      Peak Flow --      Pain Score 06/23/17 1652 9     Pain Loc --      Pain Edu? --      Excl. in GC? --    No data found.  Updated Vital Signs BP (!) 109/95 (BP Location: Left Arm)   Pulse  83   Temp 98.2 F (36.8 C) (Oral)   LMP 05/29/2017   SpO2 100%   Visual Acuity Right Eye Distance:   Left Eye Distance:   Bilateral Distance:    Right Eye Near:   Left Eye Near:    Bilateral Near:     Physical Exam  Constitutional: She is oriented to person, place, and time. She appears well-developed and well-nourished. No distress.  HENT:  Head: Normocephalic and atraumatic.  Eyes: EOM are normal.  Neck: Normal range of motion. Neck supple.  Pulmonary/Chest: Effort normal.  Musculoskeletal: She exhibits tenderness. She exhibits no edema or deformity.  Tenderness to the left parathoracic musculature and over the posterior aspect of the trapezius as well as the infraspinatus muscle.  Neurological: She is alert and oriented to person, place, and time. No cranial nerve deficit.  Skin: Skin is warm and dry.  Nursing note and vitals reviewed.    UC Treatments / Results  Labs (all labs ordered are listed, but only abnormal results are displayed) Labs Reviewed - No data to display  EKG  EKG Interpretation None       Radiology No results  found.  Procedures Procedures (including critical care time)  Medications Ordered in UC Medications - No data to display   Initial Impression / Assessment and Plan / UC Course  I have reviewed the triage vital signs and the nursing notes.  Pertinent labs & imaging results that were available during my care of the patient were reviewed by me and considered in my medical decision making (see chart for details).    Apply the diclofenac gel to the area pain 4 times a day. Them place heat over the muscle that hurts. Perform the stretch that was demonstrated several times. Do this gently.     Final Clinical Impressions(s) / UC Diagnoses   Final diagnoses:  Muscle strain    New Prescriptions This SmartLink is deprecated. Use AVSMEDLIST instead to display the medication list for a patient.   Controlled Substance Prescriptions [place] Controlled Substance Registry consulted? Not Applicable   Hayden Rasmussen, NP 06/23/17 [phone]

## 2017-10-13 ENCOUNTER — Encounter (HOSPITAL_COMMUNITY): Payer: Self-pay | Admitting: Emergency Medicine

## 2017-10-13 ENCOUNTER — Ambulatory Visit (HOSPITAL_COMMUNITY)
Admission: EM | Admit: 2017-10-13 | Discharge: 2017-10-13 | Disposition: A | Discharge status: Home / Self Care | Payer: Medicaid Other | Attending: Family Medicine | Admitting: Family Medicine

## 2017-10-13 ENCOUNTER — Other Ambulatory Visit: Payer: Self-pay

## 2017-10-13 DIAGNOSIS — K0889 Other specified disorders of teeth and supporting structures: Secondary | ICD-10-CM | POA: Diagnosis not present

## 2017-10-13 MED ORDER — KETOROLAC TROMETHAMINE 30 MG/ML IJ SOLN
INTRAMUSCULAR | Status: AC
  Filled 2017-10-13: qty 1

## 2017-10-13 MED ORDER — MELOXICAM 7.5 MG PO TABS: 7.5000 mg | ORAL_TABLET | Freq: Every day | ORAL | 0 refills | Status: DC

## 2017-10-13 MED ORDER — PENICILLIN V POTASSIUM 500 MG PO TABS: 500.0000 mg | ORAL_TABLET | Freq: Four times a day (QID) | ORAL | 0 refills | Status: AC

## 2017-10-13 MED ORDER — TRAMADOL HCL 50 MG PO TABS: 50.0000 mg | ORAL_TABLET | Freq: Four times a day (QID) | ORAL | 0 refills | Status: DC | PRN

## 2017-10-13 MED ORDER — KETOROLAC TROMETHAMINE 30 MG/ML IJ SOLN
30.0000 mg | Freq: Once | INTRAMUSCULAR | Status: AC
  Administered 2017-10-13: 30 mg via INTRAMUSCULAR

## 2017-10-13 NOTE — Discharge Instructions (Addendum)
Start Penicillin as directed for dental abscess. Mobic and oragel for pain. Do not take ibuprofen (advil, motrin)/naproxen (aleve) while on mobic. Tramadol for breakthrough pain. Follow up with dentist for further treatment and evaluation. If experiencing swelling of the throat, trouble breathing, trouble swallowing, leaning forward to breath, drooling, go to the emergency department for further evaluation.

## 2017-10-13 NOTE — ED Triage Notes (Signed)
Toothache for 3 days, tooth hurting is bottom left tooth

## 2017-10-13 NOTE — ED Provider Notes (Signed)
[facility]    CSN: [id number] Arrival date & time: 10/13/17  1341     History   Chief Complaint Chief Complaint  Patient presents with  . Dental Pain    HPI Deanna Duffy is a 22 y.o. female.   22 year old female comes in for 3-day history of dental pain.  States wisdom teeth is going on the left bottom jaw.  Denies fever, chills, night sweats.  Denies facial swelling.  Denies swelling of the throat, trouble swallowing, trouble breathing, tripoding, drooling.  She is taking ibuprofen 600 mg every few hours without relief.  She has also been applying Orajel without relief.      Past Medical History:  Diagnosis Date  . Bronchitis   . Lactose intolerance     Patient Active Problem List   Diagnosis Date Noted  . S/P cesarean section 03/03/2016  . Active labor at term 03/02/2016  . Supervision of pregnancy with history of ectopic pregnancy, second trimester 10/10/2015  . 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 Pelvic pain in female   . Ruptured ectopic pregnancy   . ECZEMA, ATOPIC DERMATITIS 10/16/2006    Past Surgical History:  Procedure Laterality Date  . CESAREAN SECTION N/A 03/03/2016   Procedure: CESAREAN SECTION;  Surgeon: Tereso Newcomer, MD;  [facility]: [facility];  Service: Obstetrics;  Laterality: N/A;  . LAPAROSCOPY N/A 03/07/2015   Procedure: LAPAROSCOPY DIAGNOSTIC;  Surgeon: Willodean Rosenthal, MD;  Location: [facility];  Service: Gynecology;  Laterality: N/A;  . UNILATERAL SALPINGECTOMY Right 03/07/2015   Procedure: UNILATERAL SALPINGECTOMYwith ectopic pregnancy;  Surgeon: Willodean Rosenthal, MD;  Location: [facility];  Service: Gynecology;  Laterality: Right;    OB History    Gravida Para Term Preterm AB Living   2 1 1   1 1    SAB TAB Ectopic Multiple Live Births       1 0 1       Home Medications    Prior to Admission medications   Medication Sig Start Date End Date Taking? Authorizing Provider  ibuprofen (ADVIL,MOTRIN) 200  MG tablet Take 200 mg by mouth every 6 (six) hours as needed.   Yes [provider]  diclofenac sodium (VOLTAREN) 1 % GEL Apply 1 application 4 (four) times daily topically. 06/23/17   Hayden Rasmussen, NP  meloxicam (MOBIC) 7.5 MG tablet Take 1 tablet (7.5 mg total) by mouth daily. 10/13/17   Cathie Hoops, Amy V, PA-C  naproxen (NAPROSYN) 375 MG tablet Take 1 tablet (375 mg total) 2 (two) times daily by mouth. Prn back pain 06/23/17   Hayden Rasmussen, NP  penicillin v potassium (VEETID) 500 MG tablet Take 1 tablet (500 mg total) by mouth 4 (four) times daily for 7 days. 10/13/17 10/20/17  Belinda Fisher, PA-C  traMADol (ULTRAM) 50 MG tablet Take 1 tablet (50 mg total) by mouth every 6 (six) hours as needed. 10/13/17   Belinda Fisher, PA-C    Family History Family History  Problem Relation Age of Onset  . Cancer Neg Hx   . Diabetes Neg Hx   . Hypertension Neg Hx     Social History Social History   Tobacco Use  . Smoking status: Never Smoker  . Smokeless tobacco: Never Used  Substance Use Topics  . Alcohol use: No  . Drug use: No     Allergies   Tuna [fish allergy] and Chocolate   Review of Systems Review of Systems  Reason unable to perform ROS: See HPI as  above.     Physical Exam Triage Vital Signs ED Triage Vitals  Enc Vitals Group     BP 10/13/17 1528 (!) 114/55     Pulse Rate 10/13/17 1528 88     Resp 10/13/17 1528 18     Temp 10/13/17 1528 98.2 F (36.8 C)     Temp Source 10/13/17 1528 Oral     SpO2 10/13/17 1528 99 %     Weight --      Height --      Head Circumference --      Peak Flow --      Pain Score 10/13/17 1526 8     Pain Loc --      Pain Edu? --      Excl. in GC? --    No data found.  Updated Vital Signs BP (!) 114/55 (BP Location: Left Arm)   Pulse 88   Temp 98.2 F (36.8 C) (Oral)   Resp 18   LMP 09/26/2017   SpO2 99%   Physical Exam  Constitutional: She is oriented to person, place, and time. She appears well-developed and well-nourished.  Patient tearful  during exam. Non toxic in appearance, no acute distress.   HENT:  Head: Normocephalic and atraumatic.  Mouth/Throat: Uvula is midline, oropharynx is clear and moist and mucous membranes are normal.    Tenderness on palpation along left bottom gum.  No obvious dental caries/cracked tooth.  No obvious dental abscess felt.  Floor of mouth soft to palpation.  No obvious facial swelling.  Eyes: Conjunctivae are normal. Pupils are equal, round, and reactive to light.  Neurological: She is alert and oriented to person, place, and time.     UC Treatments / Results  Labs (all labs ordered are listed, but only abnormal results are displayed) Labs Reviewed - No data to display  EKG  EKG Interpretation None       Radiology No results found.  Procedures Procedures (including critical care time)  Medications Ordered in UC Medications  ketorolac (TORADOL) 30 MG/ML injection 30 mg (not administered)     Initial Impression / Assessment and Plan / UC Course  I have reviewed the triage vital signs and the nursing notes.  Pertinent labs & imaging results that were available during my care of the patient were reviewed by me and considered in my medical decision making (see chart for details).    Start antibiotics for possible dental abscess. Symptomatic treatment as needed. Discussed with patient symptoms can return if dental problem is not addressed. Follow up with dentist for further evaluation and treatment of dental pain. Resources given. Return precautions given.   Final Clinical Impressions(s) / UC Diagnoses   Final diagnoses:  Pain, dental    ED Discharge Orders        Ordered    meloxicam (MOBIC) 7.5 MG tablet  Daily     10/13/17 1620    traMADol (ULTRAM) 50 MG tablet  Every 6 hours PRN     10/13/17 1620    penicillin v potassium (VEETID) 500 MG tablet  4 times daily     10/13/17 1620       Controlled Substance Prescriptions [place] Controlled Substance Registry  consulted? Yes, I have consulted the [place] Controlled Substances Registry for this patient, and feel the risk/benefit ratio today is favorable for proceeding with this prescription for a controlled substance.   Belinda FisherYu, Amy V, PA-C 10/13/17 1626

## 2018-01-12 ENCOUNTER — Ambulatory Visit (HOSPITAL_COMMUNITY)
Admission: EM | Admit: 2018-01-12 | Discharge: 2018-01-12 | Disposition: A | Discharge status: Home / Self Care | Payer: Medicaid Other | Attending: Family Medicine | Admitting: Family Medicine

## 2018-01-12 ENCOUNTER — Encounter (HOSPITAL_COMMUNITY): Payer: Self-pay | Admitting: Family Medicine

## 2018-01-12 DIAGNOSIS — J028 Acute pharyngitis due to other specified organisms: Secondary | ICD-10-CM | POA: Insufficient documentation

## 2018-01-12 DIAGNOSIS — B9789 Other viral agents as the cause of diseases classified elsewhere: Secondary | ICD-10-CM | POA: Diagnosis not present

## 2018-01-12 DIAGNOSIS — J069 Acute upper respiratory infection, unspecified: Secondary | ICD-10-CM | POA: Diagnosis not present

## 2018-01-12 DIAGNOSIS — J029 Acute pharyngitis, unspecified: Secondary | ICD-10-CM | POA: Diagnosis present

## 2018-01-12 LAB — POCT RAPID STREP A: Streptococcus, Group A Screen (Direct): NEGATIVE

## 2018-01-12 MED ORDER — LIDOCAINE VISCOUS HCL 2 % MT SOLN: 15.0000 mL | OROMUCOSAL | 0 refills | Status: DC | PRN

## 2018-01-12 MED ORDER — CETIRIZINE-PSEUDOEPHEDRINE ER 5-120 MG PO TB12: 1.0000 | ORAL_TABLET | Freq: Every day | ORAL | 0 refills | Status: DC

## 2018-01-12 NOTE — ED Provider Notes (Signed)
[facility]   [id number] 01/12/18 Arrival Time: 1157  SUBJECTIVE: History from: patient.  Deanna Duffy is a 22 y.o. female who presents with abrupt onset of sore throat that began 2 days ago.  Admits to sick exposure.  Has tried nyquil, cough drops, and benadryl without relief.  Symptoms are made worse with swallowing.  Reports previous symptoms in the past.   Complains of faitgue, rhinorrhea, and congestion.  Denies fever, chills, SOB, wheezing, chest pain, nausea, changes in bowel or bladder habits.     ROS: As per HPI.  Past Medical History:  Diagnosis Date  . Bronchitis   . Lactose intolerance    Past Surgical History:  Procedure Laterality Date  . CESAREAN SECTION N/A 03/03/2016   Procedure: CESAREAN SECTION;  Surgeon: Tereso Newcomer, MD;  Location: [facility];  Service: Obstetrics;  Laterality: N/A;  . LAPAROSCOPY N/A 03/07/2015   Procedure: LAPAROSCOPY DIAGNOSTIC;  Surgeon: Willodean Rosenthal, MD;  Location: [facility];  Service: Gynecology;  Laterality: N/A;  . UNILATERAL SALPINGECTOMY Right 03/07/2015   Procedure: UNILATERAL SALPINGECTOMYwith ectopic pregnancy;  Surgeon: Willodean Rosenthal, MD;  Location: [facility];  Service: Gynecology;  Laterality: Right;   Allergies  Allergen Reactions  . [place] Gum [Fish Allergy] Other (See Comments)    Reaction:  Unknown   . Chocolate Rash   No current facility-administered medications on file prior to encounter.    No current outpatient medications on file prior to encounter.   Social History   Socioeconomic History  . Marital status: Single    Spouse name: Not on file  . Number of children: Not on file  . Years of education: Not on file  . Highest education level: Not on file  Occupational History  . Not on file  Social Needs  . Financial resource strain: Not on file  . Food insecurity:    Worry: Not on file    Inability: Not on file  . Transportation needs:    Medical: Not on file    Non-medical:  Not on file  Tobacco Use  . Smoking status: Never Smoker  . Smokeless tobacco: Never Used  Substance and Sexual Activity  . Alcohol use: No  . Drug use: No  . Sexual activity: Yes  Lifestyle  . Physical activity:    Days per week: Not on file    Minutes per session: Not on file  . Stress: Not on file  Relationships  . Social connections:    Talks on phone: Not on file    Gets together: Not on file    Attends religious service: Not on file    Active member of club or organization: Not on file    Attends meetings of clubs or organizations: Not on file    Relationship status: Not on file  . Intimate partner violence:    Fear of current or ex partner: Not on file    Emotionally abused: Not on file    Physically abused: Not on file    Forced sexual activity: Not on file  Other Topics Concern  . Not on file  Social History Narrative  . Not on file   Family History  Problem Relation Age of Onset  . Cancer Neg Hx   . Diabetes Neg Hx   . Hypertension Neg Hx     OBJECTIVE:  Vitals:   01/12/18 1302  BP: 129/78  Pulse: 95  Resp: 18  Temp: 98.7 F (37.1 C)  TempSrc: Oral  SpO2: 100%  General appearance: alert; appears fatigued HEENT: Ears: EACs clear, TMs pearly gray with visible cone of light, without erythema; Eyes: PERRL, EOMI grossly; Sinuses tender to palpation; Nose: clear rhinorrhea; Throat: oropharynx mildly erythematous, tonsils 1-2+ erythematous without white tonsillar exudates, uvula midline Neck: LAD Lungs: CTA bilaterally without adventitious breath sounds Heart: regular rate and rhythm.  Radial pulses 2+ symmetrical bilaterally Skin: warm and dry Psychological: alert and cooperative; normal mood and affect  Imaging: No results found.  Results for orders placed or performed during the hospital encounter of 01/12/18 (from the past 24 hour(s))  POCT rapid strep A [facility])     Status: None   Collection Time: 01/12/18  1:24 PM  Result Value Ref  Range   Streptococcus, Group A Screen (Direct) NEGATIVE NEGATIVE    ASSESSMENT & PLAN:  1. Acute viral pharyngitis   2. Viral upper respiratory tract infection     Meds ordered this encounter  Medications  . lidocaine (XYLOCAINE) 2 % solution    Sig: Use as directed 15 mLs in the mouth or throat as needed (Sore throat).    Dispense:  100 mL    Refill:  0    Order Specific Question:   Supervising Provider    Answer:   Isa Rankin [[id number]]  . cetirizine-pseudoephedrine (ZYRTEC-D) 5-120 MG tablet    Sig: Take 1 tablet by mouth daily.    Dispense:  30 tablet    Refill:  0    Order Specific Question:   Supervising Provider    Answer:   Isa Rankin [[id number]]    Strep test negative, will send out for culture and we will call you with results Get plenty of rest and push fluids Lidocaine solution prescribed.  Use as directed for symptomatic relief Zyrtec D prescribed. Take daily. Do not take if breast feeding Continue OTC ibuprofen and/or tylenol as needed for symptomatic relief Follow up with PCP if symptoms persists Return or go to ER if patient has any new or worsening symptoms   Reviewed expectations re: course of current medical issues. Questions answered. Outlined signs and symptoms indicating need for more acute intervention. Patient verbalized understanding. After Visit Summary given.        Rennis Harding, PA-C 01/12/18 1350

## 2018-01-12 NOTE — Discharge Instructions (Signed)
Strep test negative, will send out for culture and we will call you with results Get plenty of rest and push fluids Lidocaine solution prescribed.  Use as directed for symptomatic relief Zyrtec D prescribed. Take daily.  Do not use if breast feeding.   Continue OTC ibuprofen and/or tylenol as needed for symptomatic relief Follow up with PCP if symptoms persists Return or go to ER if patient has any new or worsening symptoms

## 2018-01-12 NOTE — ED Triage Notes (Signed)
Pt here for sore throat since yesterday. Denies any other symptoms. Hurts to talk and swallow.

## 2018-01-14 LAB — CULTURE, GROUP A STREP (THRC)

## 2018-04-28 ENCOUNTER — Other Ambulatory Visit: Payer: Self-pay

## 2018-04-28 ENCOUNTER — Emergency Department (HOSPITAL_COMMUNITY)
Admission: EM | Admit: 2018-04-28 | Discharge: 2018-04-28 | Disposition: A | Discharge status: Home / Self Care | Payer: Medicaid Other | Attending: Emergency Medicine | Admitting: Emergency Medicine

## 2018-04-28 ENCOUNTER — Encounter (HOSPITAL_COMMUNITY): Payer: Self-pay | Admitting: *Deleted

## 2018-04-28 DIAGNOSIS — W260XXA Contact with knife, initial encounter: Secondary | ICD-10-CM | POA: Diagnosis not present

## 2018-04-28 DIAGNOSIS — S61411A Laceration without foreign body of right hand, initial encounter: Secondary | ICD-10-CM | POA: Insufficient documentation

## 2018-04-28 DIAGNOSIS — Y999 Unspecified external cause status: Secondary | ICD-10-CM | POA: Diagnosis not present

## 2018-04-28 DIAGNOSIS — Z79899 Other long term (current) drug therapy: Secondary | ICD-10-CM | POA: Insufficient documentation

## 2018-04-28 DIAGNOSIS — Y939 Activity, unspecified: Secondary | ICD-10-CM | POA: Diagnosis not present

## 2018-04-28 DIAGNOSIS — Y929 Unspecified place or not applicable: Secondary | ICD-10-CM | POA: Insufficient documentation

## 2018-04-28 MED ORDER — IBUPROFEN 800 MG PO TABS
800.0000 mg | ORAL_TABLET | Freq: Once | ORAL | Status: AC
  Administered 2018-04-28: 800 mg via ORAL
  Filled 2018-04-28: qty 1

## 2018-04-28 MED ORDER — CEPHALEXIN 250 MG PO CAPS: 250.0000 mg | ORAL_CAPSULE | Freq: Four times a day (QID) | ORAL | 0 refills | Status: AC

## 2018-04-28 NOTE — Discharge Instructions (Signed)
1. Medications: Tylenol or ibuprofen for pain, antibiotics 2. Treatment: ice for swelling, keep wound clean and covered. Soak in soapy water for 20 minutes at a time 2-3 times a day. 3. Follow Up:  Return to the emergency department or urgent care for increased redness, drainage of pus from the wound
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
## 2024-03-12 ENCOUNTER — Inpatient Hospital Stay (HOSPITAL_COMMUNITY)
Admission: EM | Admit: 2024-03-12 | Discharge: 2024-03-12 | Disposition: A | Discharge status: Home / Self Care | Payer: Self-pay | Attending: Emergency Medicine | Admitting: Emergency Medicine

## 2024-03-12 ENCOUNTER — Other Ambulatory Visit: Payer: Self-pay

## 2024-03-12 ENCOUNTER — Inpatient Hospital Stay (HOSPITAL_COMMUNITY): Payer: Self-pay

## 2024-03-12 ENCOUNTER — Encounter (HOSPITAL_COMMUNITY): Payer: Self-pay | Admitting: Obstetrics and Gynecology

## 2024-03-12 DIAGNOSIS — O209 Hemorrhage in early pregnancy, unspecified: Secondary | ICD-10-CM | POA: Insufficient documentation

## 2024-03-12 DIAGNOSIS — O3680X0 Pregnancy with inconclusive fetal viability, not applicable or unspecified: Secondary | ICD-10-CM

## 2024-03-12 DIAGNOSIS — A5901 Trichomonal vulvovaginitis: Secondary | ICD-10-CM | POA: Insufficient documentation

## 2024-03-12 DIAGNOSIS — Z3A01 Less than 8 weeks gestation of pregnancy: Secondary | ICD-10-CM

## 2024-03-12 DIAGNOSIS — A599 Trichomoniasis, unspecified: Secondary | ICD-10-CM

## 2024-03-12 DIAGNOSIS — O23591 Infection of other part of genital tract in pregnancy, first trimester: Secondary | ICD-10-CM | POA: Insufficient documentation

## 2024-03-12 LAB — CBC
HCT: 41.2 % (ref 36.0–46.0)
Hemoglobin: 13.4 g/dL (ref 12.0–15.0)
MCH: 28.1 pg (ref 26.0–34.0)
MCHC: 32.5 g/dL (ref 30.0–36.0)
MCV: 86.4 fL (ref 80.0–100.0)
Platelets: 305 K/uL (ref 150–400)
RBC: 4.77 MIL/uL (ref 3.87–5.11)
RDW: 12 % (ref 11.5–15.5)
WBC: 9.9 K/uL (ref 4.0–10.5)
nRBC: 0 % (ref 0.0–0.2)

## 2024-03-12 LAB — URINALYSIS, ROUTINE W REFLEX MICROSCOPIC
Bacteria, UA: NONE SEEN
Bilirubin Urine: NEGATIVE
Glucose, UA: NEGATIVE mg/dL
Ketones, ur: NEGATIVE mg/dL
Leukocytes,Ua: NEGATIVE
Nitrite: NEGATIVE
Protein, ur: 100 mg/dL — AB
RBC / HPF: >50 RBC/hpf (ref 0–5)
Specific Gravity, Urine: 1.027 (ref 1.005–1.030)
pH: 5 (ref 5.0–8.0)

## 2024-03-12 LAB — COMPREHENSIVE METABOLIC PANEL WITH GFR
ALT: 9 U/L (ref 0–44)
AST: 18 U/L (ref 15–41)
Albumin: 4.1 g/dL (ref 3.5–5.0)
Alkaline Phosphatase: 46 U/L (ref 38–126)
Anion gap: 12 (ref 5–15)
BUN: 8 mg/dL (ref 6–20)
CO2: 18 mmol/L — ABNORMAL LOW (ref 22–32)
Calcium: 9.8 mg/dL (ref 8.9–10.3)
Chloride: 105 mmol/L (ref 98–111)
Creatinine, Ser: 0.78 mg/dL (ref 0.44–1.00)
GFR, Estimated: >60 mL/min (ref >60)
Glucose, Bld: 126 mg/dL — ABNORMAL HIGH (ref 70–99)
Potassium: 3.7 mmol/L (ref 3.5–5.1)
Sodium: 135 mmol/L (ref 135–145)
Total Bilirubin: 0.5 mg/dL (ref 0.0–1.2)
Total Protein: 7.6 g/dL (ref 6.5–8.1)

## 2024-03-12 LAB — HCG, QUANTITATIVE, PREGNANCY: hCG, Beta Chain, Quant, S: 1388 m[IU]/mL — ABNORMAL HIGH (ref <5)

## 2024-03-12 LAB — GC/CHLAMYDIA PROBE AMP (~~LOC~~) NOT AT ARMC
Chlamydia: NEGATIVE
Comment: NEGATIVE
Comment: NORMAL
Neisseria Gonorrhea: NEGATIVE

## 2024-03-12 LAB — URINALYSIS, MICROSCOPIC (REFLEX): RBC / HPF: >50 RBC/hpf (ref 0–5)

## 2024-03-12 LAB — LIPASE, BLOOD: Lipase: 30 U/L (ref 11–51)

## 2024-03-12 LAB — HCG, SERUM, QUALITATIVE: Preg, Serum: POSITIVE — AB

## 2024-03-12 MED ORDER — METRONIDAZOLE 500 MG PO TABS
2000.0000 mg | ORAL_TABLET | Freq: Once | ORAL | Status: AC
  Administered 2024-03-12: 2000 mg via ORAL
  Filled 2024-03-12: qty 4

## 2024-03-12 NOTE — ED Notes (Signed)
MA[facility] RN notified on patient's transfer.  

## 2024-03-12 NOTE — MAU Note (Addendum)
 Deanna Duffy is a 28 y.o. at Unknown here in MAU reporting spotting Sat night. Reports abdominal pains since Monday. All wk she has continued to have vag bleeding. Abd pain is cramping. Advil  helped pain some. Hx of ectopic on L with salpingectomy  LMP: 01/18/24 Onset of complaint: Sat Pain score: 5 Vitals:   03/12/24 0353 03/12/24 0355  BP:  (!) 121/93  Pulse: 88   Resp: 17   Temp: 98.3 F (36.8 C)   SpO2: 100%      FHT: n/a  Lab orders placed from triage: none

## 2024-03-12 NOTE — ED Provider Notes (Signed)
 Emergency Medicine Provider OB Triage Evaluation Note  Deanna Duffy is a 28 y.o. female, G2P1011, at Unknown gestation who presents to the emergency department with complaints of vaginal bleeding.  Review of  Systems  Positive: lower abdominal pain and vag bleeding Negative: fever  Physical Exam  BP (!) 144/92 (BP Location: Right Arm)   Pulse (!) 116   Temp 98.2 F (36.8 C) (Oral)   Resp 18   SpO2 100%  General: Awake, no distress  HEENT: Atraumatic  Resp: Normal effort  Cardiac: Normal rate Abd: Nondistended, nontender  MSK: Moves all extremities without difficulty Neuro: Speech clear  Medical Decision Making  Pt evaluated for pregnancy concern and is stable for transfer to MAU. Pt is in agreement with plan for transfer.  US  ordered.  2:54 AM Discussed with MAU APP who accepts patient in transfer.  Clinical Impression   1. Vaginal bleeding affecting early pregnancy        Theadore Ozell HERO, MD 03/12/24 [phone]

## 2024-03-12 NOTE — MAU Provider Note (Signed)
 History     CSN: [id number]  Arrival date and time: 03/12/24 9861   Event Date/Time   First Provider Initiated Contact with Patient 03/12/24 0400      Chief Complaint  Patient presents with   Abdominal Pain   Vaginal Bleeding     Deanna Duffy is a 28 y.o. G3P1011 at [redacted]w[redacted]d by unsure LMP.  She presents today,via transfer from [facility], for abdominal pain and vaginal bleeding.  Patient states her bleeding started last week and was spotting.  She states that although it was ~ 3 weeks late, she was not concerned until the abdominal pain started on Monday. She describes the pain as cramping on 10 thousand.  She reports taking Advil  with some relief as she was able to sleep.  She reports no other known relieving factors. She rates the pain a 5/10.   OB History     Gravida  3   Para  1   Term  1   Preterm      AB  1   Living  1      SAB      IAB      Ectopic  1   Multiple  0   Live Births  1           Past Medical History:  Diagnosis Date   Bronchitis    Lactose intolerance     Past Surgical History:  Procedure Laterality Date   CESAREAN SECTION N/A 03/03/2016   Procedure: CESAREAN SECTION;  Surgeon: Gloris DELENA Hugger, MD;  Location: [facility];  Service: Obstetrics;  Laterality: N/A;   LAPAROSCOPY N/A 03/07/2015   Procedure: LAPAROSCOPY DIAGNOSTIC;  Surgeon: Elveria Mungo, MD;  Location: [facility];  Service: Gynecology;  Laterality: N/A;   UNILATERAL SALPINGECTOMY Right 03/07/2015   Procedure: UNILATERAL SALPINGECTOMYwith ectopic pregnancy;  Surgeon: Elveria Mungo, MD;  Location: [facility];  Service: Gynecology;  Laterality: Right;    Family History  Problem Relation Age of Onset   Cancer Neg Hx    Diabetes Neg Hx    Hypertension Neg Hx     Social History   Tobacco Use   Smoking status: Never   Smokeless tobacco: Never  Substance Use Topics   Alcohol use: No   Drug use: No    Allergies:  Allergies  Allergen Reactions    Tuna [Fish Allergy] Other (See Comments)    Reaction:  Unknown    Chocolate Rash    Medications Prior to Admission  Medication Sig Dispense Refill Last Dose/Taking   cetirizine -pseudoephedrine  (ZYRTEC -D) 5-120 MG tablet Take 1 tablet by mouth daily. 30 tablet 0    lidocaine  (XYLOCAINE ) 2 % solution Use as directed 15 mLs in the mouth or throat as needed (Sore throat). 100 mL 0     Review of Systems  Gastrointestinal:  Negative for constipation, diarrhea, nausea and vomiting.  Genitourinary:  Positive for vaginal bleeding. Negative for difficulty urinating, dysuria and vaginal discharge.   Physical Exam   Blood pressure (!) 121/93, pulse 88, temperature 98.3 F (36.8 C), resp. rate 17, height 5' 1 (1.549 m), weight 48.9 kg, last menstrual period 01/18/2024, SpO2 100%, unknown if currently breastfeeding.  Physical Exam Vitals and nursing note reviewed.  Constitutional:      Appearance: She is well-developed.  HENT:     Head: Normocephalic and atraumatic.  Eyes:     Conjunctiva/sclera: Conjunctivae normal.  Cardiovascular:     Rate and Rhythm: Normal rate.  Pulmonary:  Effort: Pulmonary effort is normal. No respiratory distress.  Musculoskeletal:        General: Normal range of motion.     Cervical back: Normal range of motion.  Neurological:     Mental Status: She is alert and oriented to person, place, and time.  Psychiatric:        Mood and Affect: Mood normal.        Behavior: Behavior normal.     MAU Course  Procedures Results for orders placed or performed during the hospital encounter of 03/12/24 (from the past 24 hours)  Urinalysis, Routine w reflex microscopic -Urine, Clean Catch     Status: Abnormal   Collection Time: 03/12/24  1:45 AM  Result Value Ref Range   Color, Urine RED (A) YELLOW   APPearance TURBID (A) CLEAR   Specific Gravity, Urine  1.005 - 1.030    TEST NOT REPORTED DUE TO COLOR INTERFERENCE OF URINE PIGMENT   pH  5.0 - 8.0    TEST NOT  REPORTED DUE TO COLOR INTERFERENCE OF URINE PIGMENT   Glucose, UA (A) NEGATIVE mg/dL    TEST NOT REPORTED DUE TO COLOR INTERFERENCE OF URINE PIGMENT   Hgb urine dipstick (A) NEGATIVE    TEST NOT REPORTED DUE TO COLOR INTERFERENCE OF URINE PIGMENT   Bilirubin Urine (A) NEGATIVE    TEST NOT REPORTED DUE TO COLOR INTERFERENCE OF URINE PIGMENT   Ketones, ur (A) NEGATIVE mg/dL    TEST NOT REPORTED DUE TO COLOR INTERFERENCE OF URINE PIGMENT   Protein, ur (A) NEGATIVE mg/dL    TEST NOT REPORTED DUE TO COLOR INTERFERENCE OF URINE PIGMENT   Nitrite (A) NEGATIVE    TEST NOT REPORTED DUE TO COLOR INTERFERENCE OF URINE PIGMENT   Leukocytes,Ua (A) NEGATIVE    TEST NOT REPORTED DUE TO COLOR INTERFERENCE OF URINE PIGMENT  Urinalysis, Microscopic (reflex)     Status: Abnormal   Collection Time: 03/12/24  1:45 AM  Result Value Ref Range   RBC / HPF >50 0 - 5 RBC/hpf   WBC, UA 0-5 0 - 5 WBC/hpf   Bacteria, UA MANY (A) NONE SEEN   Squamous Epithelial / HPF 0-5 0 - 5 /HPF   Trichomonas, UA PRESENT (A) NONE SEEN  Lipase, blood     Status: None   Collection Time: 03/12/24  1:48 AM  Result Value Ref Range   Lipase 30 11 - 51 U/L  Comprehensive metabolic panel     Status: Abnormal   Collection Time: 03/12/24  1:48 AM  Result Value Ref Range   Sodium 135 135 - 145 mmol/L   Potassium 3.7 3.5 - 5.1 mmol/L   Chloride 105 98 - 111 mmol/L   CO2 18 (L) 22 - 32 mmol/L   Glucose, Bld 126 (H) 70 - 99 mg/dL   BUN 8 6 - 20 mg/dL   Creatinine, Ser 9.21 0.44 - 1.00 mg/dL   Calcium 9.8 8.9 - 89.6 mg/dL   Total Protein 7.6 6.5 - 8.1 g/dL   Albumin 4.1 3.5 - 5.0 g/dL   AST 18 15 - 41 U/L   ALT 9 0 - 44 U/L   Alkaline Phosphatase 46 38 - 126 U/L   Total Bilirubin 0.5 0.0 - 1.2 mg/dL   GFR, Estimated >39 >39 mL/min   Anion gap 12 5 - 15  CBC     Status: None   Collection Time: 03/12/24  1:48 AM  Result Value Ref Range   WBC 9.9 4.0 -  10.5 K/uL   RBC 4.77 3.87 - 5.11 MIL/uL   Hemoglobin 13.4 12.0 - 15.0 g/dL    HCT 58.7 63.9 - 53.9 %   MCV 86.4 80.0 - 100.0 fL   MCH 28.1 26.0 - 34.0 pg   MCHC 32.5 30.0 - 36.0 g/dL   RDW 87.9 88.4 - 84.4 %   Platelets 305 150 - 400 K/uL   nRBC 0.0 0.0 - 0.2 %  hCG, serum, qualitative     Status: Abnormal   Collection Time: 03/12/24  1:48 AM  Result Value Ref Range   Preg, Serum POSITIVE (A) NEGATIVE  hCG, quantitative, pregnancy     Status: Abnormal   Collection Time: 03/12/24  1:48 AM  Result Value Ref Range   hCG, Beta Chain, Quant, S 1,388 (H) <5 mIU/mL  Urinalysis, Routine w reflex microscopic -Urine, Clean Catch     Status: Abnormal   Collection Time: 03/12/24  4:57 AM  Result Value Ref Range   Color, Urine AMBER (A) YELLOW   APPearance CLOUDY (A) CLEAR   Specific Gravity, Urine 1.027 1.005 - 1.030   pH 5.0 5.0 - 8.0   Glucose, UA NEGATIVE NEGATIVE mg/dL   Hgb urine dipstick LARGE (A) NEGATIVE   Bilirubin Urine NEGATIVE NEGATIVE   Ketones, ur NEGATIVE NEGATIVE mg/dL   Protein, ur 899 (A) NEGATIVE mg/dL   Nitrite NEGATIVE NEGATIVE   Leukocytes,Ua NEGATIVE NEGATIVE   RBC / HPF >50 0 - 5 RBC/hpf   WBC, UA 0-5 0 - 5 WBC/hpf   Bacteria, UA NONE SEEN NONE SEEN   Squamous Epithelial / HPF 21-50 0 - 5 /HPF   Mucus PRESENT     MDM Physical Exam Cultures: GC/CT Labs: hCG added, Other labs reviewed Ultrasound STI Treatment Coordination of Follow Up Assessment and Plan  29 year old G3P1011 at 7.5 weeks Vaginal Bleeding Trichomonas  -Results from [facility] reviewed. -Discussed ultrasound and patient agreeable. -Plan to treat trichomoniasis and obtain GC/CT. -Patient offered and declines pain medication.  -Repeat UA as initial with blood interference.   Harlene LITTIE Duncans 03/12/2024, 4:00 AM   Reassessment (5:37 AM) -Results as above. -Discussed need to repeat hCG in 48 hours. Appt made for Sunday July 27th. -Discussed treatment for trichomoniasis.  Will give flaygl 2gr now.  Encouraged to have partner treated at his local HD or PCP as she  [place]. -Precautions reviewed. -Discharged to home in stable condition.  Harlene LITTIE Duncans MSN, CNM Advanced Practice Provider, [facility] [employer]

## 2024-03-12 NOTE — ED Triage Notes (Signed)
 Patient reports pain across lower abdomen onset Monday this week with diarrhea , denies fever or emesis .

## 2024-03-12 NOTE — Progress Notes (Signed)
 Written and verbal d/c instructions given and pt voiced understanding. Will return Sun morning for repeat BHCG or sooner for any concerns.

## 2024-03-14 ENCOUNTER — Inpatient Hospital Stay (HOSPITAL_COMMUNITY)
Admission: AD | Admit: 2024-03-14 | Discharge: 2024-03-14 | Disposition: A | Discharge status: Home / Self Care | Payer: Self-pay | Attending: Obstetrics and Gynecology | Admitting: Obstetrics and Gynecology

## 2024-03-14 ENCOUNTER — Other Ambulatory Visit: Payer: Self-pay

## 2024-03-14 ENCOUNTER — Other Ambulatory Visit (HOSPITAL_COMMUNITY)
Admit: 2024-03-14 | Discharge: 2024-03-14 | Disposition: A | Discharge status: Home / Self Care | Payer: Self-pay | Attending: Obstetrics and Gynecology | Admitting: Obstetrics and Gynecology

## 2024-03-14 DIAGNOSIS — Z3A08 8 weeks gestation of pregnancy: Secondary | ICD-10-CM

## 2024-03-14 DIAGNOSIS — O039 Complete or unspecified spontaneous abortion without complication: Secondary | ICD-10-CM

## 2024-03-14 LAB — HCG, QUANTITATIVE, PREGNANCY: hCG, Beta Chain, Quant, S: 553 m[IU]/mL — ABNORMAL HIGH (ref <5)

## 2024-03-14 NOTE — MAU Provider Note (Signed)
 History     CSN: [id number]  Arrival date and time: 03/14/24 1037   None     Chief Complaint  Patient presents with   repeat bhcg   Deanna Duffy is a 28 y.o. G3P1011 at [redacted]w[redacted]d who has not established care.  She presents today for repeat betaHcG after being seen  in MAU 01/11/24.   She reports continued bleeding, though decreased from previous visit. She endorses good pain control with OTC medications.  She did pass a few blood clots yesterday, but none noted today.   OB History     Gravida  3   Para  1   Term  1   Preterm      AB  1   Living  1      SAB      IAB      Ectopic  1   Multiple  0   Live Births  1           Past Medical History:  Diagnosis Date   Bronchitis    Lactose intolerance     Past Surgical History:  Procedure Laterality Date   CESAREAN SECTION N/A 03/03/2016   Procedure: CESAREAN SECTION;  Surgeon: Gloris DELENA Hugger, MD;  Location: [facility];  Service: Obstetrics;  Laterality: N/A;   LAPAROSCOPY N/A 03/07/2015   Procedure: LAPAROSCOPY DIAGNOSTIC;  Surgeon: Elveria Mungo, MD;  Location: [facility];  Service: Gynecology;  Laterality: N/A;   UNILATERAL SALPINGECTOMY Right 03/07/2015   Procedure: UNILATERAL SALPINGECTOMYwith ectopic pregnancy;  Surgeon: Elveria Mungo, MD;  Location: [facility];  Service: Gynecology;  Laterality: Right;    Family History  Problem Relation Age of Onset   Cancer Neg Hx    Diabetes Neg Hx    Hypertension Neg Hx     Social History   Tobacco Use   Smoking status: Never   Smokeless tobacco: Never  Substance Use Topics   Alcohol use: No   Drug use: No    Allergies:  Allergies  Allergen Reactions   Tuna [Fish Allergy] Other (See Comments)    Reaction:  Unknown    Chocolate Rash    No medications prior to admission.    Review of Systems  Constitutional:  Negative for chills, fatigue, fever and unexpected weight change.  Respiratory:  Negative for cough and shortness of  breath.   Cardiovascular:  Negative for chest pain and palpitations.  Gastrointestinal:  Positive for abdominal pain. Negative for constipation, diarrhea, nausea and vomiting.  Genitourinary:  Positive for vaginal bleeding. Negative for difficulty urinating, flank pain, frequency and urgency.   Physical Exam   Blood pressure 122/83, pulse 84, temperature 98 F (36.7 C), temperature source Oral, resp. rate 17, height 5' 1 (1.549 m), weight 49.8 kg, last menstrual period 01/18/2024, SpO2 98%, unknown if currently breastfeeding.  Physical Exam Vitals reviewed.  Constitutional:      Appearance: Normal appearance.  HENT:     Head: Normocephalic.  Cardiovascular:     Rate and Rhythm: Normal rate.     Pulses: Normal pulses.  Pulmonary:     Effort: Pulmonary effort is normal.  Skin:    General: Skin is warm and dry.  Neurological:     General: No focal deficit present.     Mental Status: She is alert and oriented to person, place, and time.  Psychiatric:        Mood and Affect: Mood normal.        Behavior: Behavior normal.  Thought Content: Thought content normal.     MAU Course   Results for orders placed or performed during the hospital encounter of 03/14/24 (from the past 24 hours)  hCG, quantitative, pregnancy     Status: Abnormal   Collection Time: 03/14/24 11:02 AM  Result Value Ref Range   hCG, Beta Chain, Quant, S 553 (H) <5 mIU/mL   No results found.   MDM PE Labs: beta hCG Reviewed EMR Consulted Dr. Izell  Assessment and Plan   [phone]  Miscarriage - Plan: Discharge patient - Patient with drop in beta hCG from 1388 to 553, confirming diagnosis of miscarriage.  - Patient consoled, appropriately tearful  -Exam findings discussed. - Message sent to [facility] to schedule serial weekly beta hCG testing until virtually 0. Patient informed of POC.  - Patient to return to MAU with s/s of infection, heavy bleeding or other concerns.    Deanna DELENA Rote  MSN, CNM 03/14/2024, 12:33 PM

## 2024-03-14 NOTE — MAU Note (Signed)
 Deanna Duffy is a 28 y.o. at [redacted]w[redacted]d here in MAU reporting: here for repeat bhcg  Reports less vaginal bleeding but still present. Still wearing a pad and had a few small clots yesterday but none today so far. Lower RLQ/mid abdominal cramping and lower back pain that comes and goes. Denies any N/V/D   Pain score: 0 Vitals:   03/14/24 1048  BP: 122/83  Pulse: 84  Resp: 17  Temp: 98 F (36.7 C)  SpO2: 98%      Lab orders placed from triage:  bhcg

## 2024-03-14 NOTE — Discharge Instructions (Signed)
 Deanna Duffy,  You were seen today for a miscarriage. I am very sorry for your loss. We want you to be seen for follow up pregnancy hormone levels to make sure that they are decreasing appropriately. We recommend weekly testing until your levels are zero. We would want to see you back in MAU if you have heavy bleeding (filling a large pad in a hour or less), signs of infection (fever, abdominal tenderness to touch, chills, foul smelling) or other concerns.   Thank you for trusting us  to care for you, Deanna Duffy, Midwife

## 2024-03-16 ENCOUNTER — Telehealth: Payer: Self-pay | Admitting: Family Medicine

## 2024-03-16 NOTE — Telephone Encounter (Signed)
 Attempted to reach patient to schedule a lab appointment. Left a detailed message.

## 2024-03-16 NOTE — Telephone Encounter (Signed)
-----   Message from Camie DELENA Rote sent at 03/14/2024 12:28 PM EDT ----- Regarding: Lab Appointment Please schedule this patient for lab appointment with betaHcG 03/22/24.   Thank you, Camie
